# Patient Record
Sex: Female | Born: 1945 | Race: Black or African American | Hispanic: No | Marital: Single | Smoking: Former smoker
Health system: Southern US, Community
[De-identification: ages and names within clinical notes are randomized; demographics above are authoritative.]

## PROBLEM LIST (undated history)

## (undated) DIAGNOSIS — R011 Cardiac murmur, unspecified: Secondary | ICD-10-CM

## (undated) DIAGNOSIS — R12 Heartburn: Secondary | ICD-10-CM

## (undated) DIAGNOSIS — N189 Chronic kidney disease, unspecified: Secondary | ICD-10-CM

## (undated) DIAGNOSIS — J439 Emphysema, unspecified: Secondary | ICD-10-CM

## (undated) DIAGNOSIS — IMO0001 Reserved for inherently not codable concepts without codable children: Secondary | ICD-10-CM

## (undated) DIAGNOSIS — J302 Other seasonal allergic rhinitis: Secondary | ICD-10-CM

## (undated) DIAGNOSIS — G4730 Sleep apnea, unspecified: Secondary | ICD-10-CM

## (undated) DIAGNOSIS — I499 Cardiac arrhythmia, unspecified: Secondary | ICD-10-CM

## (undated) DIAGNOSIS — G4700 Insomnia, unspecified: Secondary | ICD-10-CM

## (undated) DIAGNOSIS — Z86010 Personal history of colonic polyps: Secondary | ICD-10-CM

## (undated) DIAGNOSIS — R112 Nausea with vomiting, unspecified: Secondary | ICD-10-CM

## (undated) DIAGNOSIS — M549 Dorsalgia, unspecified: Secondary | ICD-10-CM

## (undated) DIAGNOSIS — Z9981 Dependence on supplemental oxygen: Secondary | ICD-10-CM

## (undated) DIAGNOSIS — J189 Pneumonia, unspecified organism: Secondary | ICD-10-CM

## (undated) DIAGNOSIS — G459 Transient cerebral ischemic attack, unspecified: Secondary | ICD-10-CM

## (undated) DIAGNOSIS — M1990 Unspecified osteoarthritis, unspecified site: Secondary | ICD-10-CM

## (undated) DIAGNOSIS — F329 Major depressive disorder, single episode, unspecified: Secondary | ICD-10-CM

## (undated) DIAGNOSIS — K759 Inflammatory liver disease, unspecified: Secondary | ICD-10-CM

## (undated) DIAGNOSIS — R569 Unspecified convulsions: Secondary | ICD-10-CM

## (undated) DIAGNOSIS — R51 Headache: Secondary | ICD-10-CM

## (undated) DIAGNOSIS — F32A Depression, unspecified: Secondary | ICD-10-CM

## (undated) DIAGNOSIS — I739 Peripheral vascular disease, unspecified: Secondary | ICD-10-CM

## (undated) DIAGNOSIS — J449 Chronic obstructive pulmonary disease, unspecified: Secondary | ICD-10-CM

## (undated) DIAGNOSIS — G8929 Other chronic pain: Secondary | ICD-10-CM

## (undated) DIAGNOSIS — B999 Unspecified infectious disease: Secondary | ICD-10-CM

## (undated) DIAGNOSIS — E785 Hyperlipidemia, unspecified: Secondary | ICD-10-CM

## (undated) DIAGNOSIS — M810 Age-related osteoporosis without current pathological fracture: Secondary | ICD-10-CM

## (undated) DIAGNOSIS — I10 Essential (primary) hypertension: Secondary | ICD-10-CM

## (undated) DIAGNOSIS — F419 Anxiety disorder, unspecified: Secondary | ICD-10-CM

## (undated) DIAGNOSIS — I809 Phlebitis and thrombophlebitis of unspecified site: Secondary | ICD-10-CM

## (undated) DIAGNOSIS — R609 Edema, unspecified: Secondary | ICD-10-CM

## (undated) DIAGNOSIS — K219 Gastro-esophageal reflux disease without esophagitis: Secondary | ICD-10-CM

## (undated) HISTORY — PX: DIAGNOSTIC LAPAROSCOPY: SUR761

## (undated) HISTORY — PX: TUBAL LIGATION: SHX77

## (undated) HISTORY — PX: CHOLECYSTECTOMY: SHX55

## (undated) HISTORY — PX: ESOPHAGOGASTRODUODENOSCOPY: SHX1529

## (undated) HISTORY — PX: COLONOSCOPY: SHX174

## (undated) HISTORY — PX: WISDOM TOOTH EXTRACTION: SHX21

---

## 2008-07-24 HISTORY — PX: MULTIPLE TOOTH EXTRACTIONS: SHX2053

## 2010-07-24 HISTORY — PX: SPINAL CORD STIMULATOR INSERTION: SHX5378

## 2013-07-24 DIAGNOSIS — B999 Unspecified infectious disease: Secondary | ICD-10-CM

## 2013-07-24 HISTORY — DX: Unspecified infectious disease: B99.9

## 2013-07-24 HISTORY — PX: CARDIAC CATHETERIZATION: SHX172

## 2013-07-24 HISTORY — PX: SPINAL CORD STIMULATOR REMOVAL: SHX2423

## 2013-11-18 ENCOUNTER — Other Ambulatory Visit: Payer: Self-pay | Admitting: Neurological Surgery

## 2013-12-23 ENCOUNTER — Encounter (HOSPITAL_COMMUNITY): Payer: Self-pay | Admitting: Pharmacy Technician

## 2013-12-29 ENCOUNTER — Encounter (HOSPITAL_COMMUNITY): Payer: Self-pay

## 2013-12-29 ENCOUNTER — Ambulatory Visit (HOSPITAL_COMMUNITY)
Admission: RE | Admit: 2013-12-29 | Discharge: 2013-12-29 | Disposition: A | Discharge status: Home / Self Care | Payer: Medicare Other | Source: Ambulatory Visit | Attending: Anesthesiology | Admitting: Anesthesiology

## 2013-12-29 ENCOUNTER — Encounter (HOSPITAL_COMMUNITY)
Admission: RE | Admit: 2013-12-29 | Discharge: 2013-12-29 | Disposition: A | Discharge status: Home / Self Care | Payer: Medicare Other | Source: Ambulatory Visit | Attending: Neurological Surgery | Admitting: Neurological Surgery

## 2013-12-29 ENCOUNTER — Ambulatory Visit (HOSPITAL_COMMUNITY): Admission: RE | Admit: 2013-12-29 | Payer: No Typology Code available for payment source | Source: Ambulatory Visit

## 2013-12-29 DIAGNOSIS — M439 Deforming dorsopathy, unspecified: Secondary | ICD-10-CM | POA: Insufficient documentation

## 2013-12-29 DIAGNOSIS — Z01810 Encounter for preprocedural cardiovascular examination: Secondary | ICD-10-CM | POA: Insufficient documentation

## 2013-12-29 DIAGNOSIS — Z01818 Encounter for other preprocedural examination: Secondary | ICD-10-CM | POA: Insufficient documentation

## 2013-12-29 DIAGNOSIS — Z01812 Encounter for preprocedural laboratory examination: Secondary | ICD-10-CM | POA: Insufficient documentation

## 2013-12-29 HISTORY — DX: Emphysema, unspecified: J43.9

## 2013-12-29 HISTORY — DX: Gastro-esophageal reflux disease without esophagitis: K21.9

## 2013-12-29 HISTORY — DX: Unspecified convulsions: R56.9

## 2013-12-29 HISTORY — DX: Transient cerebral ischemic attack, unspecified: G45.9

## 2013-12-29 HISTORY — DX: Heartburn: R12

## 2013-12-29 HISTORY — DX: Unspecified osteoarthritis, unspecified site: M19.90

## 2013-12-29 HISTORY — DX: Personal history of colonic polyps: Z86.010

## 2013-12-29 HISTORY — DX: Peripheral vascular disease, unspecified: I73.9

## 2013-12-29 HISTORY — DX: Reserved for inherently not codable concepts without codable children: IMO0001

## 2013-12-29 HISTORY — DX: Other chronic pain: G89.29

## 2013-12-29 HISTORY — DX: Other seasonal allergic rhinitis: J30.2

## 2013-12-29 HISTORY — DX: Phlebitis and thrombophlebitis of unspecified site: I80.9

## 2013-12-29 HISTORY — DX: Dorsalgia, unspecified: M54.9

## 2013-12-29 HISTORY — DX: Inflammatory liver disease, unspecified: K75.9

## 2013-12-29 HISTORY — DX: Chronic obstructive pulmonary disease, unspecified: J44.9

## 2013-12-29 HISTORY — DX: Hyperlipidemia, unspecified: E78.5

## 2013-12-29 HISTORY — DX: Nausea with vomiting, unspecified: R11.2

## 2013-12-29 HISTORY — DX: Headache: R51

## 2013-12-29 HISTORY — DX: Major depressive disorder, single episode, unspecified: F32.9

## 2013-12-29 HISTORY — DX: Insomnia, unspecified: G47.00

## 2013-12-29 HISTORY — DX: Edema, unspecified: R60.9

## 2013-12-29 HISTORY — DX: Essential (primary) hypertension: I10

## 2013-12-29 HISTORY — DX: Pneumonia, unspecified organism: J18.9

## 2013-12-29 HISTORY — DX: Age-related osteoporosis without current pathological fracture: M81.0

## 2013-12-29 HISTORY — DX: Depression, unspecified: F32.A

## 2013-12-29 LAB — COMPREHENSIVE METABOLIC PANEL
ALT: 8 U/L (ref 0–35)
AST: 13 U/L (ref 0–37)
Albumin: 3.6 g/dL (ref 3.5–5.2)
Alkaline Phosphatase: 74 U/L (ref 39–117)
BUN: 15 mg/dL (ref 6–23)
CHLORIDE: 101 meq/L (ref 96–112)
CO2: 23 mEq/L (ref 19–32)
Calcium: 9.5 mg/dL (ref 8.4–10.5)
Creatinine, Ser: 1.38 mg/dL — ABNORMAL HIGH (ref 0.50–1.10)
GFR calc Af Amer: 44 mL/min — ABNORMAL LOW (ref >90)
GFR calc non Af Amer: 38 mL/min — ABNORMAL LOW (ref >90)
Glucose, Bld: 97 mg/dL (ref 70–99)
Potassium: 4 mEq/L (ref 3.7–5.3)
Sodium: 141 mEq/L (ref 137–147)
Total Bilirubin: 0.3 mg/dL (ref 0.3–1.2)
Total Protein: 7.5 g/dL (ref 6.0–8.3)

## 2013-12-29 LAB — SURGICAL PCR SCREEN
MRSA, PCR: NEGATIVE
Staphylococcus aureus: NEGATIVE

## 2013-12-29 LAB — CBC
HEMATOCRIT: 43.4 % (ref 36.0–46.0)
Hemoglobin: 13.8 g/dL (ref 12.0–15.0)
MCH: 26 pg (ref 26.0–34.0)
MCHC: 31.8 g/dL (ref 30.0–36.0)
MCV: 81.7 fL (ref 78.0–100.0)
Platelets: 262 10*3/uL (ref 150–400)
RBC: 5.31 MIL/uL — ABNORMAL HIGH (ref 3.87–5.11)
RDW: 14.4 % (ref 11.5–15.5)
WBC: 7.6 10*3/uL (ref 4.0–10.5)

## 2013-12-29 LAB — TYPE AND SCREEN
ABO/RH(D): O POS
Antibody Screen: NEGATIVE

## 2013-12-29 LAB — ABO/RH: ABO/RH(D): O POS

## 2013-12-29 NOTE — Pre-Procedure Instructions (Signed)
[facility]  12/29/2013   Your procedure is scheduled on:  Wed, June 17 @ 8:30 AM  Report to [facility]  at 6:30 AM.  Call this number if you have problems the morning of surgery: [phone]   Remember:   Do not eat food or drink liquids after midnight.   Take these medicines the morning of surgery with A SIP OF WATER: Atenolol(Tenormin),Keppra(Levetiracetam),Claritin(Loratadine-if needed),Protonix(Pantoprazole),and Phenergan(Promethazine-if needed)               Stop taking Plavix and Mag Ox. No Goody's,BC's,Aleve,Aspirin,Ibuprofen,Fish Oil,or any Herbal Medications   Do not wear jewelry, make-up or nail polish.  Do not wear lotions, powders, or perfumes. You may wear deodorant.  Do not shave 48 hours prior to surgery.   Do not bring valuables to the hospital.  [facility] is not responsible                  for any belongings or valuables.               Contacts, dentures or bridgework may not be worn into surgery.  Leave suitcase in the car. After surgery it may be brought to your room.  For patients admitted to the hospital, discharge time is determined by your                treatment team.               Patients discharged the day of surgery will not be allowed to drive  home.    Special Instructions:  [facility] - Preparing for Surgery  Before surgery, you can play an important role.  Because skin is not sterile, your skin needs to be as free of germs as possible.  You can reduce the number of germs on you skin by washing with CHG (chlorahexidine gluconate) soap before surgery.  CHG is an antiseptic cleaner which kills germs and bonds with the skin to continue killing germs even after washing.  Please DO NOT use if you have an allergy to CHG or antibacterial soaps.  If your skin becomes reddened/irritated stop using the CHG and inform your nurse when you arrive at Short Stay.  Do not shave (including legs and underarms) for at least 48 hours prior to the first CHG  shower.  You may shave your face.  Please follow these instructions carefully:   1.  Shower with CHG Soap the night before surgery and the                                morning of Surgery.  2.  If you choose to wash your hair, wash your hair first as usual with your       normal shampoo.  3.  After you shampoo, rinse your hair and body thoroughly to remove the                      Shampoo.  4.  Use CHG as you would any other liquid soap.  You can apply chg directly       to the skin and wash gently with scrungie or a clean washcloth.  5.  Apply the CHG Soap to your body ONLY FROM THE NECK DOWN.        Do not use on open wounds or open sores.  Avoid contact with your eyes,  ears, mouth and genitals (private parts).  Wash genitals (private parts)       with your normal soap.  6.  Wash thoroughly, paying special attention to the area where your surgery        will be performed.  7.  Thoroughly rinse your body with warm water from the neck down.  8.  DO NOT shower/wash with your normal soap after using and rinsing off       the CHG Soap.  9.  Pat yourself dry with a clean towel.            10.  Wear clean pajamas.            11.  Place clean sheets on your bed the night of your first shower and do not        sleep with pets.  Day of Surgery  Do not apply any lotions/deoderants the morning of surgery.  Please wear clean clothes to the hospital/surgery center.     Please read over the following fact sheets that you were given: Pain Booklet, Coughing and Deep Breathing, Blood Transfusion Information, MRSA Information and Surgical Site Infection Prevention

## 2013-12-29 NOTE — Progress Notes (Signed)
Has a cardiologist with last visit several  of yrs ago;states he is in [place] sees him as needed  Echo done at [facility] several yrs ago  Denies ever having a heart cath  Stress test done 10+yrs ago  Medical Md is [place] with [facility] in [place]  Denies EKG or CXR in past yr

## 2013-12-30 ENCOUNTER — Encounter (HOSPITAL_COMMUNITY): Payer: Self-pay | Admitting: Vascular Surgery

## 2013-12-30 ENCOUNTER — Encounter (HOSPITAL_COMMUNITY): Payer: Self-pay | Admitting: Certified Registered"

## 2013-12-30 NOTE — Progress Notes (Addendum)
Anesthesia chart review:  Patient is a 68 year old female scheduled for L4-5, L5-S1 PLIF on 01/07/14 by Dr. Yetta BarreJones.  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 Phillips GroutPhoncharoensri's note 03/2012), hepatitis C exposure s/p gamma globulin (unsure if she tested +), HLD, osteoarthritis, osteoporosis, chronic back pain, C. Difficile, chronic intermittent N/V, cholecystectomy.  Plavix held since 12/30/13 (on due to history of CVA).  PCP is Dr. Damien FusiVictoria Nnadi with [facility] in [place]. 10/10/13 records received also state that patient has CKD stage III, moderate to severe COPD (Dr. Jerre SimonJavaid 07/2012), severe pulmonary hypertension by 2014 echo, a thoracic aortic aneurysm (4.2 cm--date?--followed by Dr. Althea GrimmerBorchelt with [facility]), and an AAA (4.4 cm by renal ultrasound 08/2012 followed at [facility]). Her note also mentions that a carotid ultrasound in 03/2012 showed 80-99% RICA stenosis, but follow-up on 09/2012 showed bilateral plaque but no hemodynamically significant stenosis.  She was evaluated by cardiologist Dr. Vilinda BoehringerGary Renaldo in 2014 for an abnormal echocardiogram that showed severe pulmonary hypertension.  He felt this was due to her COPD and did not recommend further cardiac work-up at that time.    EKG on 12/29/13 showed NSR, cannot rule out anterior infarct (age undetermined). Overall, I think her EKG is stable since 11/05/12 ([facility]).  Echo on 08/27/12 showed: The LV is normal in size and wall thickness.  Mild 1+ TR.  RVSP is elevated at 67 mmHg, consistent with severe pulmonary hypertension. LVEF 60-65%. Normal LV wall motion.   Her last stress test was 10 years ago (08/12/03; [facility]).  She had a non-diagnostic stress echo due to inability to achieve there target HR, but otherwise showed no stress induced  ischemia at the stress level reached.   CXR on 12/29/13 showed: No acute infiltrate or pulmonary edema. Mild compression deformity mid thoracic spine of indeterminate age. Clinical correlation is necessary. Report routed to Dr. Yetta BarreJones.  Preoperative labs noted.  AST/ALT WNL.    I called to speak with patient this afternoon.  I was told the home number was wrong.  I did leave a voicemail on the cell number listed for patient to contact me to clarify her history and timing of her last vascular/CT follow-up.  I did review currently available records with anesthesiologist Dr. Maple HudsonMoser.  He agrees that patient should at least have medical clearance--primarily we want to know that Dr. Lissa MoralesNnadi feels her pulmonary hypertension, aneurysms, etc are being optimally managed.  At this point we would defer further preoperative referral to Dr. Lissa MoralesNnadi. I will notify Dr. Yetta BarreJones' office tomorrow. [facility] at Dr. Yetta BarreJones office was notified of need for clearance on 12/31/13 @ 9:31 AM.  I faxed her a copy of my anesthesia note and Dr. Glory RosebushNnadi's last office note. I will also fax Dr. Lissa MoralesNnadi a copy of my note.  Chart will be placed for nurse follow-up while Dr. Yetta BarreJones is awaiting input from Dr. Lissa MoralesNnadi.)  Destiny ChockAllison Burns Timson, PA-C [facility]/Anesthesiology Phone [phone] 12/30/2013 5:12 PM  Addendum: 01/01/2014 11:24 AM I received a call from patient.  She is tearful about the amount of back pain she is experiencing. She denied chest pain or new SOB.  She continues to smoke.  Her previous pulmonary and cardiology visits where arranged following her CVA hospitalization.  She is no longer being actively followed by either. Her aneurysms were previously being followed by Dr. Felizardo HoffmannBradley Thomason  in [place].  She said he was probably 1-2 years since they were last evaluated. I did notify her that anesthesia had asked Dr. Yetta Barre to get medical clearance for her surgery.    Addendum: 01/02/2014 5:18 PM I received notes from Dr. Vilinda Boehringer from today.  Apparently, she was referred to him for a preoperative evaluation.  He recommended that with her limited activity, smoking, and other risk factors that she have a preoperative Lexiscan stress Cardiolite and also see her pulmonologist preoperatively.  I am out of the office next week, so I will leave her chart for nurse follow-up who can have one of the anesthesiologists review additional records as received.   Also carotid duplex on 10/11/12 received and showed: No hemodynamically significant stenosis on either side. Bilateral plaque. Stenosis is validated by velocity measurements with corresponding angiographic measurements of velocity criteria extrapolated in a process based on that defined by the society radiologist an ultrasound.

## 2014-01-01 ENCOUNTER — Encounter (HOSPITAL_COMMUNITY): Payer: Self-pay

## 2014-01-06 MED ORDER — CEFAZOLIN SODIUM-DEXTROSE 2-3 GM-% IV SOLR: 2.0000 g | INTRAVENOUS | Status: DC

## 2014-01-06 NOTE — Progress Notes (Signed)
Talked with Erie NoeVanessa @ Dr. Ileana Laddram's  Office,pt. Is scheduled to see physician  today for clearance. Erie NoeVanessa will call when she has the clearance..Marland Kitchen

## 2014-01-07 ENCOUNTER — Inpatient Hospital Stay (HOSPITAL_COMMUNITY): Admission: RE | Admit: 2014-01-07 | Payer: Medicare Other | Source: Ambulatory Visit | Admitting: Neurological Surgery

## 2014-01-07 ENCOUNTER — Encounter (HOSPITAL_COMMUNITY): Admission: RE | Payer: Self-pay | Source: Ambulatory Visit

## 2014-01-07 SURGERY — FOR MAXIMUM ACCESS (MAS) POSTERIOR LUMBAR INTERBODY FUSION (PLIF) 2 LEVEL
Anesthesia: General | Site: Back

## 2014-01-08 ENCOUNTER — Ambulatory Visit (HOSPITAL_COMMUNITY): Payer: No Typology Code available for payment source | Admitting: Psychiatry

## 2015-01-08 ENCOUNTER — Other Ambulatory Visit: Payer: Self-pay | Admitting: Neurological Surgery

## 2015-01-19 ENCOUNTER — Encounter (HOSPITAL_COMMUNITY): Payer: Self-pay

## 2015-01-19 ENCOUNTER — Encounter (HOSPITAL_COMMUNITY)
Admission: RE | Admit: 2015-01-19 | Discharge: 2015-01-19 | Disposition: A | Discharge status: Home / Self Care | Payer: Medicare Other | Source: Ambulatory Visit | Attending: Neurological Surgery | Admitting: Neurological Surgery

## 2015-01-19 DIAGNOSIS — M4310 Spondylolisthesis, site unspecified: Secondary | ICD-10-CM | POA: Diagnosis not present

## 2015-01-19 DIAGNOSIS — I77810 Thoracic aortic ectasia: Secondary | ICD-10-CM | POA: Insufficient documentation

## 2015-01-19 DIAGNOSIS — I10 Essential (primary) hypertension: Secondary | ICD-10-CM | POA: Diagnosis not present

## 2015-01-19 DIAGNOSIS — E785 Hyperlipidemia, unspecified: Secondary | ICD-10-CM | POA: Diagnosis not present

## 2015-01-19 DIAGNOSIS — J439 Emphysema, unspecified: Secondary | ICD-10-CM | POA: Diagnosis not present

## 2015-01-19 DIAGNOSIS — Z0183 Encounter for blood typing: Secondary | ICD-10-CM | POA: Diagnosis not present

## 2015-01-19 DIAGNOSIS — Z7902 Long term (current) use of antithrombotics/antiplatelets: Secondary | ICD-10-CM | POA: Insufficient documentation

## 2015-01-19 DIAGNOSIS — Z79899 Other long term (current) drug therapy: Secondary | ICD-10-CM | POA: Diagnosis not present

## 2015-01-19 DIAGNOSIS — Z01818 Encounter for other preprocedural examination: Secondary | ICD-10-CM | POA: Diagnosis present

## 2015-01-19 DIAGNOSIS — Z8673 Personal history of transient ischemic attack (TIA), and cerebral infarction without residual deficits: Secondary | ICD-10-CM | POA: Diagnosis not present

## 2015-01-19 DIAGNOSIS — K219 Gastro-esophageal reflux disease without esophagitis: Secondary | ICD-10-CM | POA: Insufficient documentation

## 2015-01-19 DIAGNOSIS — I739 Peripheral vascular disease, unspecified: Secondary | ICD-10-CM | POA: Insufficient documentation

## 2015-01-19 DIAGNOSIS — Z01812 Encounter for preprocedural laboratory examination: Secondary | ICD-10-CM | POA: Insufficient documentation

## 2015-01-19 DIAGNOSIS — Z87891 Personal history of nicotine dependence: Secondary | ICD-10-CM | POA: Diagnosis not present

## 2015-01-19 DIAGNOSIS — G4733 Obstructive sleep apnea (adult) (pediatric): Secondary | ICD-10-CM | POA: Diagnosis not present

## 2015-01-19 HISTORY — DX: Chronic kidney disease, unspecified: N18.9

## 2015-01-19 HISTORY — DX: Dependence on supplemental oxygen: Z99.81

## 2015-01-19 HISTORY — DX: Unspecified infectious disease: B99.9

## 2015-01-19 HISTORY — DX: Cardiac arrhythmia, unspecified: I49.9

## 2015-01-19 HISTORY — DX: Anxiety disorder, unspecified: F41.9

## 2015-01-19 HISTORY — DX: Cardiac murmur, unspecified: R01.1

## 2015-01-19 HISTORY — DX: Sleep apnea, unspecified: G47.30

## 2015-01-19 LAB — TYPE AND SCREEN
ABO/RH(D): O POS
Antibody Screen: NEGATIVE

## 2015-01-19 LAB — CBC WITH DIFFERENTIAL/PLATELET
BASOS ABS: 0.1 10*3/uL (ref 0.0–0.1)
Basophils Relative: 1 % (ref 0–1)
EOS PCT: 0 % (ref 0–5)
Eosinophils Absolute: 0 10*3/uL (ref 0.0–0.7)
HEMATOCRIT: 44.3 % (ref 36.0–46.0)
HEMOGLOBIN: 14.3 g/dL (ref 12.0–15.0)
LYMPHS ABS: 2.1 10*3/uL (ref 0.7–4.0)
LYMPHS PCT: 19 % (ref 12–46)
MCH: 26.6 pg (ref 26.0–34.0)
MCHC: 32.3 g/dL (ref 30.0–36.0)
MCV: 82.5 fL (ref 78.0–100.0)
Monocytes Absolute: 0.7 10*3/uL (ref 0.1–1.0)
Monocytes Relative: 6 % (ref 3–12)
NEUTROS ABS: 8.1 10*3/uL — AB (ref 1.7–7.7)
Neutrophils Relative %: 74 % (ref 43–77)
Platelets: 229 10*3/uL (ref 150–400)
RBC: 5.37 MIL/uL — ABNORMAL HIGH (ref 3.87–5.11)
RDW: 17.8 % — AB (ref 11.5–15.5)
WBC: 11 10*3/uL — AB (ref 4.0–10.5)

## 2015-01-19 LAB — PROTIME-INR
INR: 1.06 (ref 0.00–1.49)
Prothrombin Time: 14 seconds (ref 11.6–15.2)

## 2015-01-19 LAB — BASIC METABOLIC PANEL
Anion gap: 9 (ref 5–15)
BUN: 32 mg/dL — AB (ref 6–20)
CO2: 24 mmol/L (ref 22–32)
Calcium: 9.5 mg/dL (ref 8.9–10.3)
Chloride: 105 mmol/L (ref 101–111)
Creatinine, Ser: 1.59 mg/dL — ABNORMAL HIGH (ref 0.44–1.00)
GFR calc Af Amer: 37 mL/min — ABNORMAL LOW (ref >60)
GFR calc non Af Amer: 32 mL/min — ABNORMAL LOW (ref >60)
GLUCOSE: 88 mg/dL (ref 65–99)
POTASSIUM: 5.4 mmol/L — AB (ref 3.5–5.1)
SODIUM: 138 mmol/L (ref 135–145)

## 2015-01-19 LAB — SURGICAL PCR SCREEN
MRSA, PCR: NEGATIVE
STAPHYLOCOCCUS AUREUS: NEGATIVE

## 2015-01-19 NOTE — Pre-Procedure Instructions (Addendum)
    Marvetta GibbonsSherry Rivest  01/19/2015      [facility] - [place] - [address] [place] [address] Phone: [phone] Fax: [phone]    Your procedure is scheduled on 01/28/2015 at 11:20 A.M.   [facility] at 9:20 A.M.  Call this number if you have problems the morning of surgery:  [phone]   Remember:  Do not eat food or drink liquids after midnight.  Take these medicines the morning of surgery with A SIP OF WATER Atenolol, Protonix, Claritin, Maalox, Albuterol Inhaler.     Stop Plavix, Magnesium-Oxide, Goody's/BC powders, Nonsteroidal Anti-inflammatories (NSAIDS), Aspirins, Vitamins and Herbal Supplements 7 days prior to surgery.     Do not wear jewelry, make-up or nail polish.  Do not wear lotions, powders, or perfumes.  You may wear deodorant.  Do not shave 48 hours prior to surgery.  Do not bring valuables to the hospital.  [facility] is not responsible for any belongings or valuables.  Contacts, dentures or bridgework may not be worn into surgery.  Leave your suitcase in the car.  After surgery it may be brought to your room.  For patients admitted to the hospital, discharge time will be determined by your treatment team.  Patients discharged the day of surgery will not be allowed to drive home.    Please read over the following fact sheets that you were given. Pain Booklet, Coughing and Deep Breathing, MRSA Information and Surgical Site Infection Prevention

## 2015-01-19 NOTE — Progress Notes (Signed)
Cardiologist: Dr. Vilinda BoehringerGary Renaldo- [facility]  Pulmonologist:  Dr. Lorne SkeensJavaid- [facility]  ECHO: 2014 [facility] (Care Everywhere)  EKG: Preadmission  Stress Test:  2015 [facility] (Care Everywhere)  Cardiac Cath: 2015 [facility](Care Everywhere)  Patient states she has been diagnosed with sleep apnea and has a CPAP at home but does not wear it.  She occasionally wears 2L of oxygen at night.

## 2015-01-20 NOTE — Progress Notes (Signed)
Anesthesia Chart Review:  Pt is 69 year old female scheduled for L4-5, L5-S1 PLIF on 01/28/2015 with Dr. Yetta BarreJones.   PMH includes: HTN, moderate COPD, TIA, PVD, hepatitis C, heart murmur, OSA (not on CPAP, uses 2L O2 at night), remote hx seizures, GERD, hyperlipidemia, dysrhythmia, descending thoracic aortic aneurysm (5.3x5.2 cm that tapers to 4.8cm at diaphragmatic hernia increased from 4.9x4.9cm in 2013 on CT 02/02/14), AAA ("mild aneurysmal dilatation of the upper abdominal aorta" by CT 01/05/14). Former smoker. BMI 29.   Medications include: albuterol, atenolol, plavix, combivent, lasix, hydralazine, lyrica, protonix, zocor.   Preoperative labs reviewed.    Chest x-ray 01/19/2015 reviewed. Tortuosity and ectasia of the thoracic aorta. No acute cardiopulmonary disease.  EKG 01/19/2015: NSR.   Right heart cath 04/24/2014 (care everywhere): -Normal central intracardiac filling pressures for age -No significant pulmonary arterial hypertension  Nuclear stress test 02/05/2014: -There is normal perfusion of the left ventricle. No reversible or fixed perfusion defects are identified. -Left ventricular ejection fraction is calculated to be 79 %. -Normal cardiac perfusion exam. Prognostically this is a low risk scan.  Carotid duplex US 10/11/12:  -No hemodynamically significant stenosis on either side. Bilateral plaque.   Echo 08/27/12:  -The LV is normal in size and wall thickness.  -Mild 1+ TR.  -RVSP is elevated at 67 mmHg, consistent with severe pulmonary hypertension.  -LVEF 60-65%. Normal LV wall motion.   Spoke to pt. She has not had her aneurysms evaluated since 2013 she thinks.   Discussed case with Dr. Krista BlueSinger. Pt will need vascular surgery clearance for increased thoracic aortic aneurysm prior to surgery. Notified Erie NoeVanessa in Dr. Yetta BarreJones' office.   Destiny Mastngela Hattie Pine, FNP-BC [facility] Short Stay Surgical Center/Anesthesiology Phone: [phone] 01/20/2015 4:28 PM

## 2015-01-26 NOTE — Progress Notes (Signed)
Spoke to [facility] at Dr. Yetta BarreJones office states that patient had an appointment at Vascular Surgery on Friday they have not received results. [facility] Note is not in CareEverywhere yet.

## 2015-01-27 NOTE — Progress Notes (Signed)
Still waiting on vascular clearance so surgery date was changed to 02/04/15.  [facility] for pt appointment to repeat labs as they will be too old by that date.

## 2015-01-28 ENCOUNTER — Encounter: Payer: Self-pay | Admitting: Vascular Surgery

## 2015-02-01 ENCOUNTER — Encounter: Payer: Self-pay | Admitting: Surgery

## 2015-02-05 NOTE — Progress Notes (Signed)
Anesthesia follow-up:  See notation by Kabbe,Destiny MomentNP-BC on 01/20/15. Patient was scheduled for L4-5, L5-S1 PLIF on 01/28/2015 with Dr. Yetta Barre; however, anesthesia recommended vascular surgery clearance due to increased size of a thoracic aortic aneursym and AAA. [facility] AAA is now 4.9 cm.  [facility] descending TAA is 5.5 cm. Vascular surgeon Dr. Netta Neat [facility]; see Care Everywhere) elected to watch the TAA until it reached closer to 6 cm. See his summary below: Assessment/Plan: Presents to clinic today for evaluation concerning [facility] thoracic aortic aneurysm and abdominal aortic aneurysm. [facility] is scheduled to have lower spine surgery and is in need of clearance prior to proceeding with this. [facility] recently underwent an abdominal ultrasound which redemonstrated [facility] abdominal aortic aneurysm to now measure 4.9 cm which was previously 4.4 cm in 2014. As far as [facility] abdominal aortic aneurysm is concerned we discussed that in general we elected not to fix these until he reaches size of 5.5 cm. As far as [facility] thoracic aortic aneurysm this measures 5.4 cm we've also discussed the natural history of this and that in general we elected to fix easily reaches size between 5.5 cm and 6 cm. I then depending reviewed [facility] CT imaging of [facility] thoracic aneurysm which is a noncontrasted study. 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 Given this it appears [facility] would likely need a deep branching operation in combination with placement of a thoracic endograft. This surgery would carry significant perioperative morbidity mortality risk. In patients such as this we typically elect to watch these further until they reaches size closer to 6 cm. Indeed [facility] would be at some increased risk compared to the general population to undergo spine surgery secondary to [facility] thoracic and abdominal aortic aneurysm however I feel this  risk is still small. Given [facility] lower spine disease is so debilitating to [facility] and [facility] states [facility] has no quality of life is reasonable to proceed with [facility] spine surgery while we continue to observe [facility] multiple aneurysms. I discussed with [facility] that if [facility] lower spine symptoms are manageable and I would recommend we hold off on spine surgery while we are observing [facility] aneurysms. But given [facility] is so debilitated by [facility] lower spine disease and appears frankly depressed by [facility] lack of quality of life I feel is reasonable to proceed with [facility] spine surgery. We have discussed [facility] small increased risk of proceeding with surgery and [facility] understands this risk and is willing to proceed. At this point we'll plan on seeing [facility] back in 6 months at that point I will plan on obtaining a creatinine. If [facility] creatinine is in a reasonable level we will plan on obtaining a CTA of [facility] chest abdomen and pelvis to further delineate [facility] arterial anatomy.  Prior cardiac studies are outlined in Kabbe's note, but can also be viewed in Care Everywhere.  Vascular imaging reports are outlined below, but can also be viewed in Care Everywhere.  01/29/15 Chest CT: FINDINGS: Compare 02/02/14. CT chest: Again seen is aneurysmal dilatation of the thoracic aorta beginning at the innominate takeoff and extending throughout the descending thoracic aorta. Overall, aneurysmal dilatation appears mildly increased from prior study with maximal diameter at the innominate takeoff measuring 4.9 cm (previous 4.8 cm) and descending thoracic aorta measuring 5.5 x 5.4 cm at the level of the pulmonary artery bifurcation image 34 (  previous 5.3 x 5.2 cm). The ascending aorta measures slightly larger in AP diameter, measuring 3.6 cm on image 40 (previous 3.3 cm). Normal heart size. Coronary and aortic ASVD. No adenopathy. No pleural or pericardial effusion. The trachea and mainstem bronchi are patent. Lungs remain mildly emphysematous with a stable 4 mm left lower lobe  pulmonary nodule image 47. Upper abdomen again remarkable for pneumobilia and cholecystectomy.  IMPRESSION: Mildly progressive aneurysmal dilatation.   01/22/15 AAA aortic duplex:  Bilateral: No significant common iliac artery dilatations. Aorta: The proximal abdominal aorta is dilated, with a maximum diameter of 4.9 cm. Conclusions: Proximal abdominal aortic aneurysm with a maximum diameter of 4.9 cm.  No evidence of bilateral common iliac artery aneurysms.    01/22/15 LE aneurysm scan bilateral: Right: The common femoral artery is mildly dilated, measuring 1.5 cm in maximum diameter. No evidence of significant SFA, or popliteal artery dilatations. Left: No evidence of significant CFA, SFA, or popliteal artery dilatations. Conclusions: The right common femoral artery is ectatic with a maximum diameter of 1.5 cm.  No evidence of significant right SFA, or popliteal artery aneurysms. No evidence of significant left CFA, SFA, or popliteal artery aneurysms.  Surgery has been rescheduled for 02/18/15, and [facility] is scheduled for repeat labs on 02/15/15.  Velna Ochsllison Amarilys Lyles, PA-C [facility] Short Stay Center/Anesthesiology Phone [phone] 02/05/2015 6:06 PM

## 2015-02-08 ENCOUNTER — Encounter (HOSPITAL_COMMUNITY)
Admission: RE | Admit: 2015-02-08 | Discharge: 2015-02-08 | Disposition: A | Discharge status: Home / Self Care | Payer: Medicare Other | Source: Ambulatory Visit | Attending: Neurological Surgery | Admitting: Neurological Surgery

## 2015-02-08 DIAGNOSIS — Z01818 Encounter for other preprocedural examination: Secondary | ICD-10-CM | POA: Insufficient documentation

## 2015-02-08 LAB — BASIC METABOLIC PANEL
Anion gap: 8 (ref 5–15)
BUN: 18 mg/dL (ref 6–20)
CO2: 24 mmol/L (ref 22–32)
Calcium: 9.6 mg/dL (ref 8.9–10.3)
Chloride: 108 mmol/L (ref 101–111)
Creatinine, Ser: 1.34 mg/dL — ABNORMAL HIGH (ref 0.44–1.00)
GFR calc Af Amer: 45 mL/min — ABNORMAL LOW (ref >60)
GFR calc non Af Amer: 39 mL/min — ABNORMAL LOW (ref >60)
Glucose, Bld: 88 mg/dL (ref 65–99)
Potassium: 5 mmol/L (ref 3.5–5.1)
Sodium: 140 mmol/L (ref 135–145)

## 2015-02-08 LAB — HEPATIC FUNCTION PANEL
ALBUMIN: 3.7 g/dL (ref 3.5–5.0)
ALT: 11 U/L — AB (ref 14–54)
AST: 16 U/L (ref 15–41)
Alkaline Phosphatase: 68 U/L (ref 38–126)
BILIRUBIN TOTAL: 0.5 mg/dL (ref 0.3–1.2)
Bilirubin, Direct: <0.1 mg/dL — ABNORMAL LOW (ref 0.1–0.5)
Total Protein: 7.2 g/dL (ref 6.5–8.1)

## 2015-02-08 LAB — CBC
HCT: 42.2 % (ref 36.0–46.0)
Hemoglobin: 13.9 g/dL (ref 12.0–15.0)
MCH: 27.6 pg (ref 26.0–34.0)
MCHC: 32.9 g/dL (ref 30.0–36.0)
MCV: 83.7 fL (ref 78.0–100.0)
PLATELETS: 181 10*3/uL (ref 150–400)
RBC: 5.04 MIL/uL (ref 3.87–5.11)
RDW: 18.3 % — ABNORMAL HIGH (ref 11.5–15.5)
WBC: 8.4 10*3/uL (ref 4.0–10.5)

## 2015-02-08 LAB — TYPE AND SCREEN
ABO/RH(D): O POS
ANTIBODY SCREEN: NEGATIVE

## 2015-02-08 LAB — PROTIME-INR
INR: 1.12 (ref 0.00–1.49)
PROTHROMBIN TIME: 14.6 s (ref 11.6–15.2)

## 2015-02-08 NOTE — Pre-Procedure Instructions (Signed)
    Destiny GibbonsSherry House  02/08/2015      Your procedure is scheduled on 02/18/2015.  [facility] Ad[facility] at 9:00 A.M.                 Your surgery is at 11:05AM   Call this number if you have problems the morning of surgery:  [phone]               For any other questions, please call [phone], Monday - Friday 8 AM - 4 PM.   Remember:  Do not eat food or drink liquids after midnight Wednesday, July 27.  Take these medicines the morning of surgery with A SIP OF WATER :  Atenolol, hydrALAZINE (APRESOLINE)  Claritin. Use inhalers. Please bring Albuterol  Inhaler with you to the hospital.                May take if loratadine (CLARITIN), Maalox, Dilaudid.    Stop Plavix, Magnesium-Oxide, Goody's/BC powders, Nonsteroidal Anti-inflammatories (NSAIDS), Aspirins, Vitamins and Herbal Supplements 7 days prior to surgery.     Do not wear jewelry, make-up or nail polish.  Do not wear lotions, powders, or perfumes.    Do not shave 48 hours prior to surgery.  Do not bring valuables to the hospital.  [facility] is not responsible for any belongings or valuables.  Contacts, dentures or bridgework may not be worn into surgery.  Leave your suitcase in the car.  After surgery it may be brought to your room.  For patients admitted to the hospital, discharge time will be determined by your treatment team.  Patients discharged the day of surgery will not be allowed to drive home.    Please read over the following fact sheets that you were given. Pain Booklet, Coughing and Deep Breathing, MRSA Information and Surgical Site Infection Prevention

## 2015-02-08 NOTE — Pre-Procedure Instructions (Signed)
    Marvetta GibbonsSherry Frees  02/08/2015      [facility] - [place], [place] - [address] [place] [address] Phone: [phone] Fax: [phone]    Your procedure is scheduled on 01/28/2015 at 11:20 A.M.   [facility] Ad[facility] at 9:20 A.M.  Call this number if you have problems the morning of surgery:  [phone]   Remember:  Do not eat food or drink liquids after midnight.  Take these medicines the morning of surgery with A SIP OF WATER Atenolol, Protonix, Claritin, Maalox, Albuterol Inhaler.     Stop Plavix, Magnesium-Oxide, Goody's/BC powders, Nonsteroidal Anti-inflammatories (NSAIDS), Aspirins, Vitamins and Herbal Supplements 7 days prior to surgery.     Do not wear jewelry, make-up or nail polish.  Do not wear lotions, powders, or perfumes.  You may wear deodorant.  Do not shave 48 hours prior to surgery.  Do not bring valuables to the hospital.  [facility] is not responsible for any belongings or valuables.  Contacts, dentures or bridgework may not be worn into surgery.  Leave your suitcase in the car.  After surgery it may be brought to your room.  For patients admitted to the hospital, discharge time will be determined by your treatment team.  Patients discharged the day of surgery will not be allowed to drive home.    Please read over the following fact sheets that you were given. Pain Booklet, Coughing and Deep Breathing, MRSA Information and Surgical Site Infection Prevention

## 2015-02-08 NOTE — Progress Notes (Signed)
Anesthesia update: See last anesthesia notes from 01/20/15 and 02/05/15. Patient came in for labs today.  She reported office instructed her to hold Plavix 7 days preoperatively. Today's labs noted.  I have updated anesthesiologists Dr. Maple HudsonMoser and Dr. Krista BlueSinger regarding vascular surgeon findings and recommendations. If no acute changes then would anticipate that she could proceed as planned.  [facility] Destiny Marian, PA-C [facility] Short Stay Center/Anesthesiology Phone [phone] 02/08/2015 12:10 PM

## 2015-02-08 NOTE — Progress Notes (Addendum)
I called [facility] and spoke with Destiny House, I informed her that Destiny House has been off Plavix since approximately January 01, 2015- it was on hold for June 17th surgery which was rescheduled.  Patient reported that "no one instructed her to restart it."  I also will call her PCP Destiny House and inform her.  I instructed to take Plavix as prescribed and hold 7 days prior to surgery.    PCP is Destiny ShileyJudy Na, Destiny House ,  [facility] [place].  I called and left  a message with Destiny House for Destiny House or Destiny House, that patient had been off Plavix since approximately  01/01/15 and that she was instructed to be off 7 days, and that I instructed to to start back on Plavix and stop 7 days prior to 02/18/15 surgery.  I asked Destiny BlonderDenise to ask Destiny House to call patient if  she had any different instructions.

## 2015-02-10 ENCOUNTER — Encounter: Payer: Self-pay | Admitting: Vascular Surgery

## 2015-02-15 ENCOUNTER — Other Ambulatory Visit (HOSPITAL_COMMUNITY): Payer: Self-pay

## 2015-02-15 ENCOUNTER — Inpatient Hospital Stay (HOSPITAL_COMMUNITY)
Admission: RE | Admit: 2015-02-15 | Discharge: 2015-02-15 | Disposition: A | Discharge status: Home / Self Care | Payer: Self-pay | Source: Ambulatory Visit

## 2015-02-17 MED ORDER — DEXAMETHASONE SODIUM PHOSPHATE 10 MG/ML IJ SOLN
10.0000 mg | INTRAMUSCULAR | Status: AC
  Administered 2015-02-18: 10 mg via INTRAVENOUS
  Filled 2015-02-17: qty 1

## 2015-02-17 MED ORDER — VANCOMYCIN HCL 10 G IV SOLR
1500.0000 mg | INTRAVENOUS | Status: AC
  Administered 2015-02-18: 1500 mg via INTRAVENOUS
  Filled 2015-02-17 (×2): qty 1500

## 2015-02-18 ENCOUNTER — Inpatient Hospital Stay (HOSPITAL_COMMUNITY): Payer: Medicare Other | Admitting: Vascular Surgery

## 2015-02-18 ENCOUNTER — Inpatient Hospital Stay (HOSPITAL_COMMUNITY)
Admission: RE | Admit: 2015-02-18 | Discharge: 2015-02-20 | DRG: 460 | Disposition: A | Discharge status: Skilled Nursing Facility | Payer: Medicare Other | Source: Ambulatory Visit | Attending: Neurological Surgery | Admitting: Neurological Surgery

## 2015-02-18 ENCOUNTER — Inpatient Hospital Stay (HOSPITAL_COMMUNITY): Payer: Medicare Other | Admitting: Emergency Medicine

## 2015-02-18 ENCOUNTER — Encounter (HOSPITAL_COMMUNITY)
Admission: RE | Disposition: A | Discharge status: Skilled Nursing Facility | Payer: No Typology Code available for payment source | Source: Ambulatory Visit | Attending: Neurological Surgery

## 2015-02-18 ENCOUNTER — Encounter (HOSPITAL_COMMUNITY): Payer: Self-pay | Admitting: Neurological Surgery

## 2015-02-18 ENCOUNTER — Inpatient Hospital Stay (HOSPITAL_COMMUNITY): Payer: Medicare Other

## 2015-02-18 DIAGNOSIS — M4806 Spinal stenosis, lumbar region: Secondary | ICD-10-CM | POA: Diagnosis present

## 2015-02-18 DIAGNOSIS — Z86010 Personal history of colonic polyps: Secondary | ICD-10-CM | POA: Diagnosis not present

## 2015-02-18 DIAGNOSIS — R12 Heartburn: Secondary | ICD-10-CM | POA: Diagnosis present

## 2015-02-18 DIAGNOSIS — Z8673 Personal history of transient ischemic attack (TIA), and cerebral infarction without residual deficits: Secondary | ICD-10-CM

## 2015-02-18 DIAGNOSIS — R112 Nausea with vomiting, unspecified: Secondary | ICD-10-CM | POA: Diagnosis present

## 2015-02-18 DIAGNOSIS — Z9119 Patient's noncompliance with other medical treatment and regimen: Secondary | ICD-10-CM | POA: Diagnosis present

## 2015-02-18 DIAGNOSIS — Z888 Allergy status to other drugs, medicaments and biological substances status: Secondary | ICD-10-CM

## 2015-02-18 DIAGNOSIS — Z9981 Dependence on supplemental oxygen: Secondary | ICD-10-CM | POA: Diagnosis not present

## 2015-02-18 DIAGNOSIS — Z8672 Personal history of thrombophlebitis: Secondary | ICD-10-CM

## 2015-02-18 DIAGNOSIS — K219 Gastro-esophageal reflux disease without esophagitis: Secondary | ICD-10-CM | POA: Diagnosis present

## 2015-02-18 DIAGNOSIS — Z880 Allergy status to penicillin: Secondary | ICD-10-CM

## 2015-02-18 DIAGNOSIS — J302 Other seasonal allergic rhinitis: Secondary | ICD-10-CM | POA: Diagnosis present

## 2015-02-18 DIAGNOSIS — Z87891 Personal history of nicotine dependence: Secondary | ICD-10-CM | POA: Diagnosis not present

## 2015-02-18 DIAGNOSIS — G4700 Insomnia, unspecified: Secondary | ICD-10-CM | POA: Diagnosis present

## 2015-02-18 DIAGNOSIS — E785 Hyperlipidemia, unspecified: Secondary | ICD-10-CM | POA: Diagnosis not present

## 2015-02-18 DIAGNOSIS — M810 Age-related osteoporosis without current pathological fracture: Secondary | ICD-10-CM | POA: Diagnosis present

## 2015-02-18 DIAGNOSIS — I739 Peripheral vascular disease, unspecified: Secondary | ICD-10-CM | POA: Diagnosis present

## 2015-02-18 DIAGNOSIS — G4730 Sleep apnea, unspecified: Secondary | ICD-10-CM | POA: Diagnosis present

## 2015-02-18 DIAGNOSIS — Z79891 Long term (current) use of opiate analgesic: Secondary | ICD-10-CM

## 2015-02-18 DIAGNOSIS — Z205 Contact with and (suspected) exposure to viral hepatitis: Secondary | ICD-10-CM | POA: Diagnosis present

## 2015-02-18 DIAGNOSIS — Z981 Arthrodesis status: Secondary | ICD-10-CM

## 2015-02-18 DIAGNOSIS — Z885 Allergy status to narcotic agent status: Secondary | ICD-10-CM

## 2015-02-18 DIAGNOSIS — J449 Chronic obstructive pulmonary disease, unspecified: Secondary | ICD-10-CM | POA: Diagnosis present

## 2015-02-18 DIAGNOSIS — R600 Localized edema: Secondary | ICD-10-CM | POA: Diagnosis present

## 2015-02-18 DIAGNOSIS — Z79899 Other long term (current) drug therapy: Secondary | ICD-10-CM

## 2015-02-18 DIAGNOSIS — M4316 Spondylolisthesis, lumbar region: Secondary | ICD-10-CM | POA: Diagnosis present

## 2015-02-18 DIAGNOSIS — Z419 Encounter for procedure for purposes other than remedying health state, unspecified: Secondary | ICD-10-CM

## 2015-02-18 DIAGNOSIS — Z7902 Long term (current) use of antithrombotics/antiplatelets: Secondary | ICD-10-CM

## 2015-02-18 HISTORY — PX: MAXIMUM ACCESS (MAS)POSTERIOR LUMBAR INTERBODY FUSION (PLIF) 2 LEVEL: SHX6369

## 2015-02-18 SURGERY — FOR MAXIMUM ACCESS (MAS) POSTERIOR LUMBAR INTERBODY FUSION (PLIF) 2 LEVEL
Anesthesia: General | Site: Back

## 2015-02-18 MED ORDER — MIDAZOLAM HCL 2 MG/2ML IJ SOLN
INTRAMUSCULAR | Status: AC
  Filled 2015-02-18: qty 2

## 2015-02-18 MED ORDER — SODIUM CHLORIDE 0.9 % IR SOLN
Status: DC | PRN
  Administered 2015-02-18: 500 mL

## 2015-02-18 MED ORDER — PANTOPRAZOLE SODIUM 40 MG PO TBEC
40.0000 mg | DELAYED_RELEASE_TABLET | Freq: Every day | ORAL | Status: DC
  Administered 2015-02-19 – 2015-02-20 (×2): 40 mg via ORAL
  Filled 2015-02-18 (×2): qty 1

## 2015-02-18 MED ORDER — HYDROMORPHONE HCL 1 MG/ML IJ SOLN
INTRAMUSCULAR | Status: AC
  Filled 2015-02-18: qty 1

## 2015-02-18 MED ORDER — MENTHOL 3 MG MT LOZG
1.0000 | LOZENGE | OROMUCOSAL | Status: DC | PRN
Start: 2015-02-18 — End: 2015-02-20

## 2015-02-18 MED ORDER — ACETAMINOPHEN 325 MG PO TABS: 650.0000 mg | ORAL_TABLET | ORAL | Status: DC | PRN

## 2015-02-18 MED ORDER — FUROSEMIDE 20 MG PO TABS
20.0000 mg | ORAL_TABLET | Freq: Every day | ORAL | Status: DC
  Administered 2015-02-19 – 2015-02-20 (×2): 20 mg via ORAL
  Filled 2015-02-18 (×2): qty 1

## 2015-02-18 MED ORDER — LIDOCAINE HCL (CARDIAC) 20 MG/ML IV SOLN
INTRAVENOUS | Status: DC | PRN
  Administered 2015-02-18: 40 mg via INTRAVENOUS

## 2015-02-18 MED ORDER — METHOCARBAMOL 500 MG PO TABS
500.0000 mg | ORAL_TABLET | Freq: Four times a day (QID) | ORAL | Status: DC | PRN
  Filled 2015-02-18: qty 1

## 2015-02-18 MED ORDER — ONDANSETRON HCL 4 MG/2ML IJ SOLN
4.0000 mg | INTRAMUSCULAR | Status: DC | PRN
Start: 2015-02-18 — End: 2015-02-20
  Filled 2015-02-18: qty 2

## 2015-02-18 MED ORDER — LIDOCAINE HCL 4 % MT SOLN
OROMUCOSAL | Status: DC | PRN
  Administered 2015-02-18: 4 mL via TOPICAL

## 2015-02-18 MED ORDER — SENNA 8.6 MG PO TABS
1.0000 | ORAL_TABLET | Freq: Two times a day (BID) | ORAL | Status: DC
  Administered 2015-02-18 – 2015-02-20 (×4): 8.6 mg via ORAL
  Filled 2015-02-18 (×4): qty 1

## 2015-02-18 MED ORDER — PREGABALIN 75 MG PO CAPS
150.0000 mg | ORAL_CAPSULE | Freq: Every day | ORAL | Status: DC
  Administered 2015-02-18 – 2015-02-19 (×2): 150 mg via ORAL
  Filled 2015-02-18 (×2): qty 2

## 2015-02-18 MED ORDER — HYDROMORPHONE HCL 1 MG/ML IJ SOLN
0.5000 mg | INTRAMUSCULAR | Status: DC | PRN
  Administered 2015-02-18: 1 mg via INTRAVENOUS
  Filled 2015-02-18: qty 1

## 2015-02-18 MED ORDER — PROMETHAZINE HCL 25 MG/ML IJ SOLN: 6.2500 mg | INTRAMUSCULAR | Status: DC | PRN

## 2015-02-18 MED ORDER — METHOCARBAMOL 1000 MG/10ML IJ SOLN
500.0000 mg | Freq: Four times a day (QID) | INTRAVENOUS | Status: DC | PRN
  Administered 2015-02-18: 500 mg via INTRAVENOUS
  Filled 2015-02-18 (×2): qty 5

## 2015-02-18 MED ORDER — MIDAZOLAM HCL 5 MG/5ML IJ SOLN
INTRAMUSCULAR | Status: DC | PRN
  Administered 2015-02-18: 2 mg via INTRAVENOUS

## 2015-02-18 MED ORDER — ALBUTEROL SULFATE (2.5 MG/3ML) 0.083% IN NEBU: 3.0000 mL | INHALATION_SOLUTION | RESPIRATORY_TRACT | Status: DC | PRN

## 2015-02-18 MED ORDER — EPHEDRINE SULFATE 50 MG/ML IJ SOLN
INTRAMUSCULAR | Status: DC | PRN
  Administered 2015-02-18 (×2): 5 mg via INTRAVENOUS
  Administered 2015-02-18 (×2): 10 mg via INTRAVENOUS

## 2015-02-18 MED ORDER — PROMETHAZINE HCL 25 MG/ML IJ SOLN
INTRAMUSCULAR | Status: AC
  Filled 2015-02-18: qty 1

## 2015-02-18 MED ORDER — POTASSIUM CHLORIDE IN NACL 20-0.9 MEQ/L-% IV SOLN
INTRAVENOUS | Status: DC
  Administered 2015-02-18: 17:00:00 via INTRAVENOUS
  Administered 2015-02-19: 1000 mL via INTRAVENOUS
  Filled 2015-02-18 (×2): qty 1000

## 2015-02-18 MED ORDER — IPRATROPIUM-ALBUTEROL 0.5-2.5 (3) MG/3ML IN SOLN
3.0000 mL | Freq: Four times a day (QID) | RESPIRATORY_TRACT | Status: DC
  Administered 2015-02-18: 3 mL via RESPIRATORY_TRACT
  Filled 2015-02-18: qty 3

## 2015-02-18 MED ORDER — ACETAMINOPHEN 650 MG RE SUPP: 650.0000 mg | RECTAL | Status: DC | PRN

## 2015-02-18 MED ORDER — THROMBIN 5000 UNITS EX SOLR
CUTANEOUS | Status: DC | PRN
  Administered 2015-02-18: 10 mL via TOPICAL

## 2015-02-18 MED ORDER — PROPOFOL 10 MG/ML IV BOLUS
INTRAVENOUS | Status: AC
  Filled 2015-02-18: qty 20

## 2015-02-18 MED ORDER — SODIUM CHLORIDE 0.9 % IV SOLN: 250.0000 mL | INTRAVENOUS | Status: DC

## 2015-02-18 MED ORDER — HYDROMORPHONE HCL 2 MG PO TABS
2.0000 mg | ORAL_TABLET | ORAL | Status: DC | PRN
  Administered 2015-02-18 – 2015-02-20 (×6): 2 mg via ORAL
  Filled 2015-02-18 (×6): qty 1

## 2015-02-18 MED ORDER — PHENOL 1.4 % MT LIQD: 1.0000 | OROMUCOSAL | Status: DC | PRN

## 2015-02-18 MED ORDER — BUPIVACAINE HCL (PF) 0.25 % IJ SOLN
INTRAMUSCULAR | Status: DC | PRN
  Administered 2015-02-18: 4 mL

## 2015-02-18 MED ORDER — SODIUM CHLORIDE 0.9 % IJ SOLN: 3.0000 mL | INTRAMUSCULAR | Status: DC | PRN

## 2015-02-18 MED ORDER — ATENOLOL 25 MG PO TABS
50.0000 mg | ORAL_TABLET | Freq: Every day | ORAL | Status: DC
  Administered 2015-02-19 – 2015-02-20 (×2): 50 mg via ORAL
  Filled 2015-02-18 (×2): qty 2

## 2015-02-18 MED ORDER — ONDANSETRON HCL 4 MG/2ML IJ SOLN
INTRAMUSCULAR | Status: DC | PRN
  Administered 2015-02-18: 4 mg via INTRAVENOUS

## 2015-02-18 MED ORDER — FENTANYL CITRATE (PF) 250 MCG/5ML IJ SOLN
INTRAMUSCULAR | Status: AC
  Filled 2015-02-18: qty 5

## 2015-02-18 MED ORDER — THROMBIN 20000 UNITS EX SOLR
CUTANEOUS | Status: DC | PRN
  Administered 2015-02-18: 20 mL via TOPICAL

## 2015-02-18 MED ORDER — MEPERIDINE HCL 25 MG/ML IJ SOLN: 6.2500 mg | INTRAMUSCULAR | Status: DC | PRN

## 2015-02-18 MED ORDER — PROMETHAZINE HCL 25 MG PO TABS
25.0000 mg | ORAL_TABLET | ORAL | Status: DC | PRN
  Administered 2015-02-19 (×2): 25 mg via ORAL
  Filled 2015-02-18 (×2): qty 1

## 2015-02-18 MED ORDER — LACTATED RINGERS IV SOLN
INTRAVENOUS | Status: DC
  Administered 2015-02-18: 10:00:00 via INTRAVENOUS

## 2015-02-18 MED ORDER — CELECOXIB 200 MG PO CAPS
200.0000 mg | ORAL_CAPSULE | Freq: Two times a day (BID) | ORAL | Status: DC
  Administered 2015-02-18 – 2015-02-20 (×4): 200 mg via ORAL
  Filled 2015-02-18 (×4): qty 1

## 2015-02-18 MED ORDER — SODIUM CHLORIDE 0.9 % IJ SOLN
3.0000 mL | Freq: Two times a day (BID) | INTRAMUSCULAR | Status: DC
  Administered 2015-02-18 – 2015-02-20 (×3): 3 mL via INTRAVENOUS

## 2015-02-18 MED ORDER — DEXAMETHASONE SODIUM PHOSPHATE 4 MG/ML IJ SOLN
4.0000 mg | Freq: Four times a day (QID) | INTRAMUSCULAR | Status: DC
  Administered 2015-02-19: 4 mg via INTRAVENOUS
  Filled 2015-02-18 (×2): qty 1

## 2015-02-18 MED ORDER — HYDRALAZINE HCL 25 MG PO TABS
25.0000 mg | ORAL_TABLET | Freq: Two times a day (BID) | ORAL | Status: DC
  Administered 2015-02-18 – 2015-02-20 (×4): 25 mg via ORAL
  Filled 2015-02-18 (×4): qty 1

## 2015-02-18 MED ORDER — PHENYLEPHRINE HCL 10 MG/ML IJ SOLN
INTRAMUSCULAR | Status: DC | PRN
  Administered 2015-02-18 (×3): 120 ug via INTRAVENOUS

## 2015-02-18 MED ORDER — HYDROMORPHONE HCL 1 MG/ML IJ SOLN
0.2500 mg | INTRAMUSCULAR | Status: DC | PRN
  Administered 2015-02-18 (×2): 0.5 mg via INTRAVENOUS

## 2015-02-18 MED ORDER — PROPOFOL 10 MG/ML IV BOLUS
INTRAVENOUS | Status: DC | PRN
  Administered 2015-02-18: 150 mg via INTRAVENOUS

## 2015-02-18 MED ORDER — SUCCINYLCHOLINE CHLORIDE 20 MG/ML IJ SOLN
INTRAMUSCULAR | Status: DC | PRN
  Administered 2015-02-18: 100 mg via INTRAVENOUS

## 2015-02-18 MED ORDER — DEXAMETHASONE 4 MG PO TABS
4.0000 mg | ORAL_TABLET | Freq: Four times a day (QID) | ORAL | Status: DC
  Administered 2015-02-18 – 2015-02-20 (×7): 4 mg via ORAL
  Filled 2015-02-18 (×6): qty 1

## 2015-02-18 MED ORDER — 0.9 % SODIUM CHLORIDE (POUR BTL) OPTIME
TOPICAL | Status: DC | PRN
  Administered 2015-02-18: 1000 mL

## 2015-02-18 MED ORDER — FENTANYL CITRATE (PF) 250 MCG/5ML IJ SOLN
INTRAMUSCULAR | Status: DC | PRN
  Administered 2015-02-18 (×3): 50 ug via INTRAVENOUS
  Administered 2015-02-18: 150 ug via INTRAVENOUS
  Administered 2015-02-18 (×2): 100 ug via INTRAVENOUS

## 2015-02-18 MED ORDER — CEFAZOLIN SODIUM 1-5 GM-% IV SOLN
1.0000 g | Freq: Three times a day (TID) | INTRAVENOUS | Status: AC
  Administered 2015-02-18 – 2015-02-19 (×2): 1 g via INTRAVENOUS
  Filled 2015-02-18 (×2): qty 50

## 2015-02-18 MED FILL — Heparin Sodium (Porcine) Inj 1000 Unit/ML: INTRAMUSCULAR | Qty: 30 | Status: AC

## 2015-02-18 MED FILL — Sodium Chloride IV Soln 0.9%: INTRAVENOUS | Qty: 1000 | Status: AC

## 2015-02-18 SURGICAL SUPPLY — 74 items
BAG DECANTER FOR FLEXI CONT (MISCELLANEOUS) ×3 IMPLANT
BENZOIN TINCTURE PRP APPL 2/3 (GAUZE/BANDAGES/DRESSINGS) ×3 IMPLANT
BIT DRILL PLIF MAS 5.0MM DISP (DRILL) ×1 IMPLANT
BLADE CLIPPER SURG (BLADE) IMPLANT
BONE MATRIX OSTEOCEL PRO MED (Bone Implant) ×3 IMPLANT
BUR MATCHSTICK NEURO 3.0 LAGG (BURR) ×3 IMPLANT
CAGE COROENT PLIF 10X28-8 LUMB (Cage) ×6 IMPLANT
CAGE MAS PLIF 9X9X23-8 LUMBAR (Cage) ×6 IMPLANT
CANISTER SUCT 3000ML PPV (MISCELLANEOUS) ×3 IMPLANT
CLIP NEUROVISION LG (CLIP) ×6 IMPLANT
CLOSURE WOUND 1/2 X4 (GAUZE/BANDAGES/DRESSINGS) ×2
CONT SPEC 4OZ CLIKSEAL STRL BL (MISCELLANEOUS) ×6 IMPLANT
COVER BACK TABLE 24X17X13 BIG (DRAPES) IMPLANT
COVER BACK TABLE 60X90IN (DRAPES) ×3 IMPLANT
DRAPE C-ARM 42X72 X-RAY (DRAPES) ×3 IMPLANT
DRAPE C-ARMOR (DRAPES) ×3 IMPLANT
DRAPE LAPAROTOMY 100X72X124 (DRAPES) ×3 IMPLANT
DRAPE POUCH INSTRU U-SHP 10X18 (DRAPES) ×3 IMPLANT
DRAPE SURG 17X23 STRL (DRAPES) ×3 IMPLANT
DRILL PLIF MAS 5.0MM DISP (DRILL) ×3
DRSG OPSITE 4X5.5 SM (GAUZE/BANDAGES/DRESSINGS) ×3 IMPLANT
DRSG OPSITE POSTOP 4X6 (GAUZE/BANDAGES/DRESSINGS) ×3 IMPLANT
DRSG TELFA 3X8 NADH (GAUZE/BANDAGES/DRESSINGS) ×3 IMPLANT
DURAPREP 26ML APPLICATOR (WOUND CARE) ×3 IMPLANT
ELECT REM PT RETURN 9FT ADLT (ELECTROSURGICAL) ×3
ELECTRODE REM PT RTRN 9FT ADLT (ELECTROSURGICAL) ×1 IMPLANT
EVACUATOR 1/8 PVC DRAIN (DRAIN) ×6 IMPLANT
GAUZE SPONGE 4X4 16PLY XRAY LF (GAUZE/BANDAGES/DRESSINGS) IMPLANT
GLOVE BIO SURGEON STRL SZ8 (GLOVE) ×6 IMPLANT
GLOVE BIOGEL PI IND STRL 7.0 (GLOVE) ×2 IMPLANT
GLOVE BIOGEL PI IND STRL 8 (GLOVE) ×1 IMPLANT
GLOVE BIOGEL PI IND STRL 8.5 (GLOVE) ×1 IMPLANT
GLOVE BIOGEL PI INDICATOR 7.0 (GLOVE) ×4
GLOVE BIOGEL PI INDICATOR 8 (GLOVE) ×2
GLOVE BIOGEL PI INDICATOR 8.5 (GLOVE) ×2
GLOVE ECLIPSE 7.5 STRL STRAW (GLOVE) ×3 IMPLANT
GLOVE SS N UNI LF 7.0 STRL (GLOVE) ×12 IMPLANT
GOWN STRL REUS W/ TWL LRG LVL3 (GOWN DISPOSABLE) ×2 IMPLANT
GOWN STRL REUS W/ TWL XL LVL3 (GOWN DISPOSABLE) ×3 IMPLANT
GOWN STRL REUS W/TWL 2XL LVL3 (GOWN DISPOSABLE) IMPLANT
GOWN STRL REUS W/TWL LRG LVL3 (GOWN DISPOSABLE) ×4
GOWN STRL REUS W/TWL XL LVL3 (GOWN DISPOSABLE) ×6
HEMOSTAT POWDER KIT SURGIFOAM (HEMOSTASIS) IMPLANT
KIT BASIN OR (CUSTOM PROCEDURE TRAY) ×3 IMPLANT
KIT NEEDLE NVM5 EMG ELECT (KITS) ×1 IMPLANT
KIT NEEDLE NVM5 EMG ELECTRODE (KITS) ×2
KIT ROOM TURNOVER OR (KITS) ×3 IMPLANT
MILL MEDIUM DISP (BLADE) ×3 IMPLANT
NEEDLE HYPO 25X1 1.5 SAFETY (NEEDLE) ×3 IMPLANT
NS IRRIG 1000ML POUR BTL (IV SOLUTION) ×3 IMPLANT
PACK LAMINECTOMY NEURO (CUSTOM PROCEDURE TRAY) ×3 IMPLANT
PAD ARMBOARD 7.5X6 YLW CONV (MISCELLANEOUS) ×9 IMPLANT
ROD 60MM LUMBAR (Rod) ×6 IMPLANT
SCREW LOCK (Screw) ×12 IMPLANT
SCREW LOCK FXNS SPNE MAS PL (Screw) ×6 IMPLANT
SCREW PLIF MAS 5.0X35 (Screw) ×6 IMPLANT
SCREW SHANK 5.0X35 (Screw) ×6 IMPLANT
SCREW SHANK 6.5X40 (Screw) ×4 IMPLANT
SCREW SHANK 6.5X40 NS LF (Screw) ×2 IMPLANT
SCREW TULIP 5.5 (Screw) ×12 IMPLANT
SPONGE LAP 4X18 X RAY DECT (DISPOSABLE) IMPLANT
SPONGE SURGIFOAM ABS GEL 100 (HEMOSTASIS) ×3 IMPLANT
STRIP CLOSURE SKIN 1/2X4 (GAUZE/BANDAGES/DRESSINGS) ×4 IMPLANT
SUT VIC AB 0 CT1 18XCR BRD8 (SUTURE) ×1 IMPLANT
SUT VIC AB 0 CT1 8-18 (SUTURE) ×2
SUT VIC AB 2-0 CP2 18 (SUTURE) ×3 IMPLANT
SUT VIC AB 3-0 SH 8-18 (SUTURE) ×6 IMPLANT
SYR 20ML ECCENTRIC (SYRINGE) ×3 IMPLANT
SYR 3ML LL SCALE MARK (SYRINGE) IMPLANT
TAPE STRIPS DRAPE STRL (GAUZE/BANDAGES/DRESSINGS) ×3 IMPLANT
TOWEL OR 17X24 6PK STRL BLUE (TOWEL DISPOSABLE) ×3 IMPLANT
TOWEL OR 17X26 10 PK STRL BLUE (TOWEL DISPOSABLE) ×3 IMPLANT
TRAY FOLEY W/METER SILVER 14FR (SET/KITS/TRAYS/PACK) ×3 IMPLANT
WATER STERILE IRR 1000ML POUR (IV SOLUTION) ×3 IMPLANT

## 2015-02-18 NOTE — Progress Notes (Signed)
Pt was admitted to room 4N21. Admission vital sign is stable

## 2015-02-18 NOTE — Op Note (Signed)
02/18/2015  2:33 PM  PATIENT:  Destiny House  69 y.o. 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 Decompressive lumbar laminectomy L4-5 L5-S1 requiring more work than would be required for a simple exposure of the disk for PLIF in order to adequately decompress the neural elements and address the spinal stenosis 2. Posterior lumbar interbody fusion L4-5 L 5-S1 using PEEK interbody cages packed with morcellized allograft and autograft 3. Posterior fixation L4-S1 inclusive using cortical pedicle screws.  4. Intertransverse arthrodesis L4-S1 using morcellized autograft and allograft.  SURGEON:  Marikay Alar, MD  ASSISTANTS: Dr. Newell Coral  ANESTHESIA:  General  EBL: 350 ml  Total I/O In: 500 [IV Piggyback:500] Out: 350 [Blood:350]  BLOOD ADMINISTERED:none  DRAINS: Hemovac   INDICATION FOR PROCEDURE: This patient presented with a long history of back and leg pain. MRI showed a grade 1 spondylolisthesis L4-5 and L5-S1 with spinal stenosis. She tried medical management without relief. I recommended decompression and instrumented fusion to address her spinal stenosis and segmental instability. Patient understood the risks, benefits, and alternatives and potential outcomes and wished to proceed.  PROCEDURE DETAILS:  The patient was brought to the operating room. After induction of generalized endotracheal anesthesia the patient was rolled into the prone position on chest rolls and all pressure points were padded. The patient's lumbar region was cleaned and then prepped with DuraPrep and draped in the usual sterile fashion. Anesthesia was injected and then a dorsal midline incision was made and carried down to the lumbosacral fascia. The fascia was opened and the paraspinous musculature was taken down in a subperiosteal fashion to expose L4-S1. A  self-retaining retractor was placed. Intraoperative fluoroscopy confirmed my level, and I started with placement of the L4 cortical pedicle screws. The pedicle screw entry zones were identified utilizing surface landmarks and  AP and lateral fluoroscopy. I scored the cortex with the high-speed drill and then used the hand drill and EMG monitoring to drill an upward and outward direction into the pedicle. I then tapped line to line, and the tap was also monitored. I then placed a 5-0 x 30 mm cortical pedicle screw into the pedicles of L4 bilaterally. I then turned my attention to the decompression and the spinous process was removed and complete lumbar laminectomies, hemi- facetectomies, and foraminotomies were performed at L4-5 and L5-S1. The patient had significant spinal stenosis and this required more work than would be required for a simple exposure of the disc for posterior lumbar interbody fusion. Much more generous decompression was undertaken in order to adequately decompress the neural elements and address the patient's leg pain. The yellow ligament was removed to expose the underlying dura and nerve roots, and generous foraminotomies were performed to adequately decompress the neural elements. Both the exiting and traversing nerve roots were decompressed on both sides until a coronary dilator passed easily along the nerve roots. Once the decompression was complete, I turned my attention to the posterior lower lumbar interbody fusion. The epidural venous vasculature was coagulated and cut sharply. Disc space was incised and the initial discectomy was performed with pituitary rongeurs. The disc space was distracted with sequential distractors to a height of 10 mm at L4-5 and 9 mm at L5-S1. We then used a series of scrapers and shavers to prepare the endplates for fusion. The midline was prepared with Epstein curettes. Once the complete discectomy was finished,  we packed an appropriate sized peek interbody  cage with local autograft and morcellized allograft, gently retracted the nerve root, and tapped the cage into position at L4-5 and L5-S1.  The midline between the cages was packed with morselized autograft and allograft. We then turned our attention to the placement of the lower pedicle screws. The pedicle screw entry zones were identified utilizing surface landmarks and fluoroscopy. I drilled into each pedicle utilizing the hand drill and EMG monitoring, and tapped each pedicle with the appropriate tap. We palpated with a ball probe to assure no break in the cortex. We then placed 5-0 x 35 mm cortical pedicle screws at L4 and 6-5 x 40 mm pedicle screws into the pedicles bilaterally at S1. We then decorticated the transverse processes and laid a mixture of morcellized autograft and allograft out over these to perform intertransverse arthrodesis at L4-S1. We then placed lordotic rods into the multiaxial screw heads of the pedicle screws and locked these in position with the locking caps and anti-torque device. We then checked our construct with AP and lateral fluoroscopy. Irrigated with copious amounts of bacitracin-containing saline solution. Placed a medium Hemovac drain through separate stab incision. Inspected the nerve roots once again to assure adequate decompression, lined to the dura with Gelfoam, and closed the muscle and the fascia with 0 Vicryl. Closed the subcutaneous tissues with 2-0 Vicryl and subcuticular tissues with 3-0 Vicryl. The skin was closed with benzoin and Steri-Strips. Dressing was then applied, the patient was awakened from general anesthesia and transported to the recovery room in stable condition. At the end of the procedure all sponge, needle and instrument counts were correct.   PLAN OF CARE: Admit to inpatient   PATIENT DISPOSITION:  PACU - hemodynamically stable.   Delay start of Pharmacological VTE agent (>24hrs) due to surgical blood loss or risk of bleeding:  yes

## 2015-02-18 NOTE — Transfer of Care (Signed)
`  Immediate Anesthesia Transfer of Care Note  Patient: Destiny House  Procedure(s) Performed: Procedure(s) with comments: MAS PLIF  - L4-L5 - L5-S1  (N/A) - MAS PLIF  - L4-L5 - L5-S1   Patient Location: PACU  Anesthesia Type:General  Level of Consciousness: awake, alert  and oriented  Airway & Oxygen Therapy: Patient Spontanous Breathing and Patient connected to nasal cannula oxygen  Post-op Assessment: Report given to RN and Post -op Vital signs reviewed and stable  Post vital signs: Reviewed and stable  Last Vitals:  Filed Vitals:   02/18/15 0919  BP: 133/84  Pulse: 84  Temp: 36.8 C  Resp: 20    Complications: No apparent anesthesia complications

## 2015-02-18 NOTE — Anesthesia Preprocedure Evaluation (Addendum)
Anesthesia Evaluation  Patient identified by MRN, date of birth, ID band Patient awake    Reviewed: Allergy & Precautions, NPO status , Patient's Chart, lab work & pertinent test results  Airway Mallampati: II  TM Distance: >3 FB Neck ROM: Full    Dental no notable dental hx.    Pulmonary neg pulmonary ROS, sleep apnea , pneumonia -, COPDformer smoker,  breath sounds clear to auscultation  Pulmonary exam normal       Cardiovascular hypertension, Pt. on medications + Peripheral Vascular Disease Normal cardiovascular exam+ dysrhythmias + Valvular Problems/Murmurs Rhythm:Regular Rate:Normal     Neuro/Psych negative neurological ROS  negative psych ROS   GI/Hepatic negative GI ROS, Neg liver ROS, GERD-  ,(+) Hepatitis -  Endo/Other  negative endocrine ROS  Renal/GU CRFRenal diseasenegative Renal ROS  negative genitourinary   Musculoskeletal negative musculoskeletal ROS (+)   Abdominal   Peds negative pediatric ROS (+)  Hematology negative hematology ROS (+)   Anesthesia Other Findings   Reproductive/Obstetrics negative OB ROS                            Anesthesia Physical Anesthesia Plan  ASA: III  Anesthesia Plan: General   Post-op Pain Management:    Induction: Intravenous  Airway Management Planned: Oral ETT  Additional Equipment: Arterial line  Intra-op Plan:   Post-operative Plan: Extubation in OR  Informed Consent: I have reviewed the patients History and Physical, chart, labs and discussed the procedure including the risks, benefits and alternatives for the proposed anesthesia with the patient or authorized representative who has indicated his/her understanding and acceptance.   Dental advisory given  Plan Discussed with: CRNA  Anesthesia Plan Comments:        Anesthesia Quick Evaluation

## 2015-02-18 NOTE — Progress Notes (Signed)
Report given to [facility] shaver rn

## 2015-02-18 NOTE — Anesthesia Procedure Notes (Signed)
Procedure Name: Intubation Performed by: Mariea Clonts Preoxygenation: Pre-oxygenation with 100% oxygen Intubation Type: IV induction Ventilation: Mask ventilation without difficulty Laryngoscope Size: Miller and 2 Grade View: Grade I Tube type: Oral Tube size: 7.5 mm Number of attempts: 1 Airway Equipment and Method: LTA kit utilized Placement Confirmation: ETT inserted through vocal cords under direct vision,  breath sounds checked- equal and bilateral and positive ETCO2 Tube secured with: Tape Dental Injury: Teeth and Oropharynx as per pre-operative assessment

## 2015-02-18 NOTE — H&P (Signed)
Subjective: Patient is a 69 y.o. female admitted for PLIF. Onset of symptoms was several months ago, gradually worsening since that time.  The pain is rated severe, and is located at the across the lower back and radiates to legs. The pain is described as aching and occurs all day. The symptoms have been progressive. Symptoms are exacerbated by exercise. MRI or CT showed spondylolisthesis L4-5, L5-S1 .  Past Medical History  Diagnosis Date  . Hyperlipidemia     takes Simvastatin daily  . Nausea and vomiting     takes Phenergan daily as needed   . Seasonal allergies     takes Claritin daily as needed  . GERD (gastroesophageal reflux disease)     takes Protonix daily  . Heartburn     takes Mylanta daily as needed  . Insomnia     takes Sominex nightly as needed  . Hypertension     takes Atenolol daily as well HCTZ  . Peripheral edema     takes Lasix daily  . Phlebitis in her 20's  . COPD (chronic obstructive pulmonary disease)   . Emphysema lung   . Pneumonia     hx of;last time 44yrs ago   . Headache(784.0)     occasionally  . TIA (transient ischemic attack)     takes Plavix daily  . Arthritis   . Osteoarthritis   . Osteoporosis   . Chronic back pain     spondylolisthesis  . History of colon polyps   . Depression     but doesn't take any meds  . History of Clostridium difficile > 1 yr ago  . Peripheral vascular disease     thrombophlebitis RLE in her 20's  . 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 Seizures     A few years ago; was on Keppra but no longer taking (over a year) no seizures since.  Marland Kitchen Dysrhythmia     asymptomatic; occasional  . Heart murmur   . Anxiety   . Chronic kidney disease   . Infection 2015    of esophagus  . On home oxygen therapy     sometimes wears oxygen at night  . Sleep apnea     noncompliant with CPAP (has at home). Currently wears 2L of O2 at night on occasion.      Past Surgical History  Procedure Laterality Date  . Tubal ligation  at age 25  . Cholecystectomy  11 yrs ago  . Colonoscopy    . Esophagogastroduodenoscopy    . Cardiac catheterization  2015  . Wisdom tooth extraction    . Multiple tooth extractions  2010  . Diagnostic laparoscopy    . Spinal cord stimulator insertion  2012  . Spinal cord stimulator removal  2015    Prior to Admission medications   Medication Sig Start Date End Date Taking? Authorizing Provider  albuterol (PROAIR HFA) 108 (90 BASE) MCG/ACT inhaler Inhale 2 puffs into the lungs every 4 (four) hours as needed for wheezing or shortness of breath.  11/06/13  Yes Historical Provider, MD  alum & mag hydroxide-simeth (MAALOX/MYLANTA) 200-200-20 MG/5ML suspension Take 30 mLs by mouth every 6 (six) hours as needed for indigestion or heartburn.   Yes Historical Provider, MD  atenolol (TENORMIN) 50 MG tablet Take 50 mg by mouth daily.   Yes Historical Provider, MD  clopidogrel (PLAVIX) 75 MG tablet Take  75 mg by mouth daily with breakfast.   Yes Historical Provider, MD  COMBIVENT RESPIMAT 20-100 MCG/ACT AERS respimat Inhale 1 puff into the lungs 4 (four) times daily. 01/11/15  Yes Historical Provider, MD  diphenhydrAMINE (SOMINEX) 25 MG tablet Take 25 mg by mouth every 6 (six) hours as needed for itching or sleep.   Yes Historical Provider, MD  furosemide (LASIX) 20 MG tablet Take 20 mg by mouth daily.    Yes Historical Provider, MD  hydrALAZINE (APRESOLINE) 25 MG tablet Take 25 mg by mouth 2 (two) times daily.   Yes Historical Provider, MD  HYDROmorphone (DILAUDID) 2 MG tablet Take 2 mg by mouth every 8 (eight) hours as needed (pain).  01/05/15  Yes Historical Provider, MD  loratadine (CLARITIN) 10 MG tablet Take 10 mg by mouth daily as needed for allergies.   Yes Historical Provider, MD  LYRICA 150 MG capsule Take 150 mg by mouth at bedtime. 01/15/15  Yes Historical Provider, MD  magnesium oxide (MAG-OX) 400 MG tablet Take 400 mg by  mouth 2 (two) times daily.   Yes Historical Provider, MD  pantoprazole (PROTONIX) 40 MG tablet Take 40 mg by mouth daily.   Yes Historical Provider, MD  promethazine (PHENERGAN) 25 MG tablet Take 25 mg by mouth every 4 (four) hours as needed for nausea or vomiting.   Yes Historical Provider, MD  simvastatin (ZOCOR) 80 MG tablet Take 80 mg by mouth daily.   Yes Historical Provider, MD   Allergies  Allergen Reactions  . Benicar [Olmesartan]     Headache  . Gabapentin Nausea And Vomiting    Itching  . Hydrocodone Nausea And Vomiting    Itching  . Nystatin Itching  . Oxycodone Itching  . Talwin [Pentazocine] Nausea And Vomiting    Itching  . Vistaril [Hydroxyzine] Itching  . Penicillins Rash    History  Substance Use Topics  . Smoking status: Former Smoker -- 0.25 packs/day for 35 years    Types: Cigarettes    Quit date: 09/22/2014  . Smokeless tobacco: Not on file  . Alcohol Use: No    History reviewed. No pertinent family history.   Review of Systems  Positive ROS: neg  All other systems have been reviewed and were otherwise negative with the exception of those mentioned in the HPI and as above.  Objective: Vital signs in last 24 hours: Temp:  [98.3 F (36.8 C)] 98.3 F (36.8 C) (07/28 0919) Pulse Rate:  [84] 84 (07/28 0919) Resp:  [20] 20 (07/28 0919) BP: (133)/(84) 133/84 mmHg (07/28 0919) SpO2:  [99 %] 99 % (07/28 0919) Weight:  [162 lb (73.483 kg)] 162 lb (73.483 kg) (07/28 0919)  General Appearance: Alert, cooperative, no distress, appears stated age Head: Normocephalic, without obvious abnormality, atraumatic Eyes: PERRL, conjunctiva/corneas clear, EOM's intact    Neck: Supple, symmetrical, trachea midline Back: Symmetric, no curvature, ROM normal, no CVA tenderness Lungs:  respirations unlabored Heart: Regular rate and rhythm Abdomen: Soft, non-tender Extremities: Extremities normal, atraumatic, no cyanosis or edema Pulses: 2+ and symmetric all  extremities Skin: Skin color, texture, turgor normal, no rashes or lesions  NEUROLOGIC:   Mental status: Alert and oriented x4,  no aphasia, good attention span, fund of knowledge, and memory Motor Exam - grossly normal Sensory Exam - grossly normal Reflexes: 1+ Coordination - grossly normal Gait - grossly normal Balance - grossly normal Cranial Nerves: I: smell Not tested  II: visual acuity  OS: nl    OD: nl  II: visual fields Full to confrontation  II: pupils Equal, round, reactive to light  III,VII: ptosis None  III,IV,VI: extraocular muscles  Full ROM  V: mastication Normal  V: facial light touch sensation  Normal  V,VII: corneal reflex  Present  VII: facial muscle function - upper  Normal  VII: facial muscle function - lower Normal  VIII: hearing Not tested  IX: soft palate elevation  Normal  IX,X: gag reflex Present  XI: trapezius strength  5/5  XI: sternocleidomastoid strength 5/5  XI: neck flexion strength  5/5  XII: tongue strength  Normal    Data Review Lab Results  Component Value Date   WBC 8.4 02/08/2015   HGB 13.9 02/08/2015   HCT 42.2 02/08/2015   MCV 83.7 02/08/2015   PLT 181 02/08/2015   Lab Results  Component Value Date   NA 140 02/08/2015   K 5.0 02/08/2015   CL 108 02/08/2015   CO2 24 02/08/2015   BUN 18 02/08/2015   CREATININE 1.34* 02/08/2015   GLUCOSE 88 02/08/2015   Lab Results  Component Value Date   INR 1.12 02/08/2015    Assessment/Plan: Patient admitted for PLIF. Patient has failed a reasonable attempt at conservative therapy.  I explained the condition and procedure to the patient and answered any questions.  Patient wishes to proceed with procedure as planned. Understands risks/ benefits and typical outcomes of procedure.   Gaynelle Pastrana S 02/18/2015 9:47 AM

## 2015-02-18 NOTE — Progress Notes (Signed)
A line d/c with cath tip intact pressure helt by 5 min and pressure bandage applies

## 2015-02-19 ENCOUNTER — Encounter (HOSPITAL_COMMUNITY): Payer: Self-pay | Admitting: Neurological Surgery

## 2015-02-19 MED ORDER — ALUM & MAG HYDROXIDE-SIMETH 200-200-20 MG/5ML PO SUSP
30.0000 mL | Freq: Four times a day (QID) | ORAL | Status: DC | PRN
  Administered 2015-02-19 – 2015-02-20 (×4): 30 mL via ORAL
  Filled 2015-02-19 (×4): qty 30

## 2015-02-19 MED ORDER — IPRATROPIUM-ALBUTEROL 0.5-2.5 (3) MG/3ML IN SOLN
3.0000 mL | Freq: Two times a day (BID) | RESPIRATORY_TRACT | Status: DC
  Administered 2015-02-19 – 2015-02-20 (×2): 3 mL via RESPIRATORY_TRACT
  Filled 2015-02-19 (×2): qty 3

## 2015-02-19 MED ORDER — IPRATROPIUM-ALBUTEROL 0.5-2.5 (3) MG/3ML IN SOLN
3.0000 mL | Freq: Four times a day (QID) | RESPIRATORY_TRACT | Status: DC
  Administered 2015-02-19: 3 mL via RESPIRATORY_TRACT
  Filled 2015-02-19: qty 3

## 2015-02-19 NOTE — Progress Notes (Signed)
OT NOTE  Order in order set for brace currently. NO brace present in room. Please follow order set instructions to call ortho tech [phone])  to order brace. OT to return to complete evaluation upon brace arrival.   Shanaye Rief, Brynn   OTR/L Pager: [phone] Office: [phone] .   

## 2015-02-19 NOTE — Progress Notes (Signed)
Orthopedic Tech Progress Note Patient Details:  Destiny House [birth year] [id number]  Patient ID: Destiny House, female   DOB: 1945/11/11, 69 y.o.   MRN: [id number] Called in bio-tech brace order; spoke with [name], Graclyn Lawther 02/19/2015, 9:21 AM

## 2015-02-19 NOTE — Clinical Social Work Placement (Signed)
   CLINICAL SOCIAL WORK PLACEMENT  NOTE  Date:  02/19/2015  Patient Details  Name: Tabita Corbo MRN: [id number] Date of Birth: 01/12/1946  Clinical Social Work is seeking post-discharge placement for this patient at the Skilled  Nursing Facility level of care (*CSW will initial, date and re-position this form in  chart as items are completed):  Yes   Patient/family provided with [facility] Clinical Social Work Department's list of facilities offering this level of care within the geographic area requested by the patient (or if unable, by the patient's family).  Yes   Patient/family informed of their freedom to choose among providers that offer the needed level of care, that participate in Medicare, Medicaid or managed care program needed by the patient, have an available bed and are willing to accept the patient.  Yes   Patient/family informed of [facility]'s ownership interest in [facility] and [facility], as well as of the fact that they are under no obligation to receive care at these facilities.  PASRR submitted to EDS on       PASRR number received on       Existing PASRR number confirmed on 02/19/15     FL2 transmitted to all facilities in geographic area requested by pt/family on 02/19/15     FL2 transmitted to all facilities within larger geographic area on       Patient informed that his/her managed care company has contracts with or will negotiate with certain facilities, including the following:            Patient/family informed of bed offers received.  Patient chooses bed at       Physician recommends and patient chooses bed at      Patient to be transferred to   on  .  Patient to be transferred to facility by       Patient family notified on   of transfer.  Name of family member notified:        PHYSICIAN       Additional Comment:    _______________________________________________ Gwynne Edinger, LCSW 02/19/2015, 2:31 PM

## 2015-02-19 NOTE — Clinical Social Work Note (Signed)
Clinical Social Work Assessment  Patient Details  Name: Destiny House MRN: [id number] Date of Birth: 1946/02/26  Date of referral:  02/19/15               Reason for consult:  Facility Placement              Housing/Transportation Living arrangements for the past 2 months:  Apartment Source of Information:  Patient Patient Interpreter Needed:  None Criminal Activity/Legal Involvement Pertinent to Current Situation/Hospitalization:  No - Comment as needed Significant Relationships:  Adult Children Lives with:  Self Do you feel safe going back to the place where you live?  No Need for family participation in patient care:  No (Coment)  Care giving concerns:  N/A  Facilities manager / plan:  CSW met the pt at bedside. CSW introduced self and purpose of the visit. CSW discussed SNF rehab. CSW explained the SNF process. CSW explained insurance and its relation to SNF placement. CSW and pt reviewed SNF list. CSW and pt discussed geographic location in which the she would like to receive rehab. The pt reported that she will like to remain in [place]. CSW answered all questions in which the pt inquired about. CSW will continue to follow this pt and assist with discharge as needed.   Employment status:  Retired Nurse, adult PT Recommendations:  [place] / Referral to community resources:  [place]  Patient/Family's Response to care:  Pt shared that the care in which she received has been well.   Patient/Family's Understanding of and Emotional Response to Diagnosis, Current Treatment, and Prognosis: Pt acknowledged her diagnosis and her current treatment. Pt acknowledged that she will need rehab prior to returning home.   Emotional Assessment Appearance:  Appears stated age Attitude/Demeanor/Rapport:   (positive ) Affect (typically observed):  Pleasant, Accepting Orientation:  Oriented to Self, Oriented to  Place, Oriented to  Time, Oriented to Situation Alcohol / Substance use:  Not Applicable Psych involvement (Current and /or in the community):  No (Comment)  Discharge Needs  Concerns to be addressed:  Denies Needs/Concerns at this time Readmission within the last 30 days:  No Current discharge risk:  None Barriers to Discharge:  No Barriers Identified   Joshua Soulier, LCSW 02/19/2015, 2:07 PM

## 2015-02-19 NOTE — Anesthesia Postprocedure Evaluation (Signed)
Anesthesia Post Note  Patient: Destiny House  Procedure(s) Performed: Procedure(s) (LRB): MAS PLIF  - L4-L5 - L5-S1  (N/A)  Anesthesia type: General  Patient location: PACU  Post pain: Pain level controlled  Post assessment: Post-op Vital signs reviewed  Last Vitals: BP 111/76 mmHg  Pulse 90  Temp(Src) 37.2 C (Oral)  Resp 19  Wt 162 lb (73.483 kg)  SpO2 97%  Post vital signs: Reviewed  Level of consciousness: sedated  Complications: No apparent anesthesia complications

## 2015-02-19 NOTE — Progress Notes (Signed)
Utilization review completed. Rewa Weissberg, RN, BSN. 

## 2015-02-19 NOTE — Evaluation (Signed)
Physical Therapy Evaluation Patient Details Name: Destiny House MRN: [id number] DOB: Dec 13, 1945 Today's Date: 02/19/2015   History of Present Illness  69 y.o. female s/p PLIF L4-5 L5-S1. PMH: HTN, hyperlipidemia, peripheral edema, COPD, TIA, seizures, arthritis  Clinical Impression  Patient is s/p above surgery resulting in the deficits listed below (see PT Problem List). Patient will benefit from skilled PT to increase their independence and safety with mobility (while adhering to their precautions) to allow discharge to the venue listed below.    Follow Up Recommendations SNF    Equipment Recommendations  None recommended by PT (Patient reports having equipment)    Recommendations for Other Services       Precautions / Restrictions Precautions Precautions: Back Precaution Booklet Issued: Yes (comment) Precaution Comments: education and handout provided Required Braces or Orthoses: Spinal Brace Spinal Brace: Applied in sitting position Restrictions Weight Bearing Restrictions: No      Mobility  Bed Mobility Overal bed mobility: Needs Assistance Bed Mobility: Rolling;Sidelying to Sit;Sit to Sidelying Rolling: Min assist Sidelying to sit: Min assist     Sit to sidelying: Min assist General bed mobility comments: cues for log roll, verbal + tactile  Transfers Overall transfer level: Needs assistance Equipment used: Rolling walker (2 wheeled) Transfers: Sit to/from Stand Sit to Stand: Min guard            Ambulation/Gait Ambulation/Gait assistance: Min guard Ambulation Distance (Feet): 50 Feet Assistive device: Rolling walker (2 wheeled) Gait Pattern/deviations: Step-through pattern Gait velocity: slow pattern      Stairs            Wheelchair Mobility    Modified Rankin (Stroke Patients Only)       Balance Overall balance assessment: Needs assistance Sitting-balance support: No upper extremity supported Sitting balance-Leahy Scale: Fair     Standing balance support: Single extremity supported Standing balance-Leahy Scale: Poor                               Pertinent Vitals/Pain Pain Assessment: 0-10 Pain Score: 5  Pain Location: back Pain Descriptors / Indicators: Other (Comment) (hurts) Pain Intervention(s): Monitored during session;Repositioned    Home Living Family/patient expects to be discharged to:: Skilled nursing facility Living Arrangements: Alone Available Help at Discharge: Other (Comment) (None) Type of Home: Apartment Home Access: Other (comment) (reports level entry)   Entrance Stairs-Number of Steps: "a couple" Home Layout: One level Home Equipment: Walker - 4 wheels;Cane - single point      Prior Function Level of Independence: Independent               Hand Dominance   Dominant Hand: Right    Extremity/Trunk Assessment   Upper Extremity Assessment: Overall WFL for tasks assessed           Lower Extremity Assessment: Generalized weakness         Communication   Communication: No difficulties  Cognition Arousal/Alertness: Awake/alert Behavior During Therapy: WFL for tasks assessed/performed Overall Cognitive Status: Within Functional Limits for tasks assessed       Memory: Decreased recall of precautions              General Comments      Exercises        Assessment/Plan    PT Assessment Patient needs continued PT services  PT Diagnosis Difficulty walking;Generalized weakness   PT Problem List Decreased strength;Decreased activity tolerance;Decreased range of motion;Decreased balance;Decreased mobility;Decreased knowledge of use  of DME;Decreased safety awareness;Decreased knowledge of precautions  PT Treatment Interventions DME instruction;Gait training;Stair training;Functional mobility training;Therapeutic activities;Therapeutic exercise;Patient/family education   PT Goals (Current goals can be found in the Care Plan section) Acute Rehab PT  Goals Patient Stated Goal: eventually get home PT Goal Formulation: With patient Time For Goal Achievement: 03/05/15 Potential to Achieve Goals: Good    Frequency Min 4X/week   Barriers to discharge        Co-evaluation               End of Session Equipment Utilized During Treatment: Gait belt;Back brace;Oxygen Activity Tolerance: Patient tolerated treatment well Patient left: in bed;with call bell/phone within reach;with SCD's reapplied Nurse Communication: Mobility status         Time: 1610-9604 PT Time Calculation (min) (ACUTE ONLY): 30 min   Charges:   PT Evaluation $Initial PT Evaluation Tier I: 1 Procedure PT Treatments $Gait Training: 8-22 mins   PT G Codes:        Christiane Ha, PT, CSCS Pager [phone] Office [phone]  02/19/2015, 2:22 PM

## 2015-02-19 NOTE — Evaluation (Signed)
Occupational Therapy Evaluation Patient Details Name: Destiny House MRN: [id number] DOB: [birth year] Today's Date: 02/19/2015    History of Present Illness 69 y.o. female s/p PLIF L4-5 L5-S1. PMH: HTN, hyperlipidemia, peripheral edema, COPD, TIA, seizures, arthritis   Clinical Impression   Patient is s/p PLIF L4-5 L5-S1 surgery resulting in functional limitations due to the deficits listed below (see OT problem list). Education and handout provided on back precautions; pt requires VC's to maintain precautions during ADLs. Pt demonstrates ability to don brace with min (A).  Patient will benefit from skilled OT acutely to increase independence and safety with ADLS prior to d/c. Pt requesting d/c to SNF for rehab due to no caregiver support and fear of falling at home.     Follow Up Recommendations  SNF    Equipment Recommendations  None recommended by OT    Recommendations for Other Services       Precautions / Restrictions Precautions Precautions: Back Precaution Booklet Issued: Yes (comment) Precaution Comments: education and handout provided Required Braces or Orthoses: Spinal Brace Spinal Brace: Lumbar corset;Applied in sitting position Restrictions Weight Bearing Restrictions: No      Mobility Bed Mobility Overal bed mobility: Needs Assistance Bed Mobility: Rolling;Sidelying to Sit Rolling: Supervision Sidelying to sit: Supervision       General bed mobility comments: VC for technique  Transfers Overall transfer level: Needs assistance Equipment used: Rolling walker (2 wheeled) Transfers: Sit to/from Stand Sit to Stand: Min guard;From elevated surface              Balance Overall balance assessment: Needs assistance;History of Falls Sitting-balance support: No upper extremity supported;Feet supported Sitting balance-Leahy Scale: Good     Standing balance support: Single extremity supported;During functional activity Standing balance-Leahy Scale:  Fair                              ADL Overall ADL's : Needs assistance/impaired Eating/Feeding: Independent;Sitting   Grooming: Wash/dry hands;Wash/dry face;Oral care;Supervision/safety;Standing (VC's to maintain precautions)   Upper Body Bathing: Minimal assitance;Sitting   Lower Body Bathing: Minimal assistance;Cueing for back precautions;Sit to/from stand   Upper Body Dressing : Minimal assistance;Sitting (don brace)   Lower Body Dressing: Minimal assistance;Cueing for back precautions;Sit to/from stand   Toilet Transfer: Min guard;BSC;Ambulation   Toileting- Architect and Hygiene: Modified independent;Sit to/from stand       Functional mobility during ADLs: Min guard;Rolling walker       Vision Vision Assessment?: No apparent visual deficits   Perception     Praxis      Pertinent Vitals/Pain Pain Assessment: No/denies pain     Hand Dominance Right   Extremity/Trunk Assessment Upper Extremity Assessment Upper Extremity Assessment: Overall WFL for tasks assessed   Lower Extremity Assessment Lower Extremity Assessment: Overall WFL for tasks assessed       Communication Communication Communication: No difficulties   Cognition Arousal/Alertness: Awake/alert Behavior During Therapy: WFL for tasks assessed/performed Overall Cognitive Status: Within Functional Limits for tasks assessed       Memory: Decreased recall of precautions             General Comments       Exercises       Shoulder Instructions      Home Living Family/patient expects to be discharged to:: Private residence Living Arrangements: Alone Available Help at Discharge: Other (Comment) (None) Type of Home: Apartment Home Access: Stairs to enter Entrance Stairs-Number of Steps: "a couple"  Home Layout: One level     Bathroom Shower/Tub: Tub/shower unit;Curtain Shower/tub characteristics: Engineer, building services: Standard     Home Equipment:  Environmental consultant - 2 wheels;Cane - single point;Bedside commode;Shower seat;Hand held shower head          Prior Functioning/Environment Level of Independence: Independent             OT Diagnosis: Generalized weakness;Acute pain   OT Problem List: Decreased strength;Decreased activity tolerance;Impaired balance (sitting and/or standing);Decreased safety awareness;Decreased knowledge of use of DME or AE;Decreased knowledge of precautions;Pain   OT Treatment/Interventions: Self-care/ADL training;Therapeutic exercise;DME and/or AE instruction;Therapeutic activities;Energy conservation;Patient/family education;Balance training    OT Goals(Current goals can be found in the care plan section) Acute Rehab OT Goals Patient Stated Goal: to get out of bed OT Goal Formulation: With patient Time For Goal Achievement: 03/05/15 Potential to Achieve Goals: Good ADL Goals Pt Will Perform Grooming: standing (maintain precautions) Pt Will Perform Upper Body Dressing: with supervision;sitting Pt Will Perform Lower Body Dressing: with supervision;sit to/from stand Pt Will Transfer to Toilet: with supervision;ambulating;bedside commode Additional ADL Goal #1: Pt will verbalize 3/3 precautions as precursor to ADLs.  OT Frequency: Min 2X/week   Barriers to D/C: Decreased caregiver support  Pt reports no one available to assist upon d/c; requesing SNF for rehab       Co-evaluation              End of Session Equipment Utilized During Treatment: Gait belt;Rolling walker;Back brace;Oxygen Nurse Communication: Mobility status;Precautions  Activity Tolerance: Patient tolerated treatment well;No increased pain Patient left: in chair;with call bell/phone within reach;with nursing/sitter in room   Time: 1055-1119 OT Time Calculation (min): 24 min Charges:  OT General Charges $OT Visit: 1 Procedure OT Evaluation $Initial OT Evaluation Tier I: 1 Procedure OT Treatments $Self Care/Home Management :  8-22 mins G-Codes:    Marden Noble 02/19/2015, 11:59 AM

## 2015-02-19 NOTE — Discharge Summary (Signed)
Physician Discharge Summary  Patient ID: Destiny House MRN: [id number] DOB/AGE: 08/27/1945 69 y.o.  Admit date: 02/18/2015 Discharge date: 02/19/2015  Admission Diagnoses: spondylolisthesis/ stenosis L4-5, L5-S1   Discharge Diagnoses: same   Discharged Condition: good  Hospital Course: The patient was admitted on 02/18/2015 and taken to the operating room where the patient underwent PLIF L4-5, L5-S1. The patient tolerated the procedure well and was taken to the recovery room and then to the floor in stable condition. The hospital course was routine. There were no complications. The wound remained clean dry and intact. Pt had appropriate back soreness. No complaints of leg pain or new N/T/W. The patient remained afebrile with stable vital signs, and tolerated a regular diet. The patient continued to increase activities, and pain was well controlled with oral pain medications.   Consults: None  Significant Diagnostic Studies:  Results for orders placed or performed during the hospital encounter of 02/08/15  Basic metabolic panel  Result Value Ref Range   Sodium 140 135 - 145 mmol/L   Potassium 5.0 3.5 - 5.1 mmol/L   Chloride 108 101 - 111 mmol/L   CO2 24 22 - 32 mmol/L   Glucose, Bld 88 65 - 99 mg/dL   BUN 18 6 - 20 mg/dL   Creatinine, Ser 4.09 (H) 0.44 - 1.00 mg/dL   Calcium 9.6 8.9 - 81.1 mg/dL   GFR calc non Af Amer 39 (L) >60 mL/min   GFR calc Af Amer 45 (L) >60 mL/min   Anion gap 8 5 - 15  CBC  Result Value Ref Range   WBC 8.4 4.0 - 10.5 K/uL   RBC 5.04 3.87 - 5.11 MIL/uL   Hemoglobin 13.9 12.0 - 15.0 g/dL   HCT 91.4 78.2 - 95.6 %   MCV 83.7 78.0 - 100.0 fL   MCH 27.6 26.0 - 34.0 pg   MCHC 32.9 30.0 - 36.0 g/dL   RDW 21.3 (H) 08.6 - 57.8 %   Platelets 181 150 - 400 K/uL  PT- INR at PAT visit (Pre-admission Testing)  Result Value Ref Range   Prothrombin Time 14.6 11.6 - 15.2 seconds   INR 1.12 0.00 - 1.49  Hepatic function panel  Result Value Ref Range   Total  Protein 7.2 6.5 - 8.1 g/dL   Albumin 3.7 3.5 - 5.0 g/dL   AST 16 15 - 41 U/L   ALT 11 (L) 14 - 54 U/L   Alkaline Phosphatase 68 38 - 126 U/L   Total Bilirubin 0.5 0.3 - 1.2 mg/dL   Bilirubin, Direct <4.6 (L) 0.1 - 0.5 mg/dL   Indirect Bilirubin NOT CALCULATED 0.3 - 0.9 mg/dL  Type and screen  Result Value Ref Range   ABO/RH(D) O POS    Antibody Screen NEG    Sample Expiration 02/22/2015     Dg Lumbar Spine 2-3 Views  02/18/2015   CLINICAL DATA:  Lumbar disc disease.  EXAM: DG C-ARM 61-120 MIN; LUMBAR SPINE - 2-3 VIEW  COMPARISON:  MRI dated 01/16/2015  FINDINGS: AP and lateral C-arm images demonstrate the patient has undergone interbody and posterior fusion at L4-5 and L5-S1. Hardware appears in good position in the AP and lateral projections.  IMPRESSION: Fusions performed at L4-5 and L5-S1.   Electronically Signed   By: Francene Boyers M.D.   On: 02/18/2015 14:24   Dg C-arm 1-60 Min  02/18/2015   CLINICAL DATA:  Lumbar disc disease.  EXAM: DG C-ARM 61-120 MIN; LUMBAR SPINE - 2-3 VIEW  COMPARISON:  MRI dated 01/16/2015  FINDINGS: AP and lateral C-arm images demonstrate the patient has undergone interbody and posterior fusion at L4-5 and L5-S1. Hardware appears in good position in the AP and lateral projections.  IMPRESSION: Fusions performed at L4-5 and L5-S1.   Electronically Signed   By: Francene Boyers M.D.   On: 02/18/2015 14:24    Antibiotics:  Anti-infectives    Start     Dose/Rate Route Frequency Ordered Stop   02/18/15 1700  ceFAZolin (ANCEF) IVPB 1 g/50 mL premix     1 g 100 mL/hr over 30 Minutes Intravenous Every 8 hours 02/18/15 1620 02/19/15 0130   02/18/15 1041  bacitracin 50,000 Units in sodium chloride irrigation 0.9 % 500 mL irrigation  Status:  Discontinued       As needed 02/18/15 1041 02/18/15 1431   02/18/15 1000  vancomycin (VANCOCIN) 1,500 mg in sodium chloride 0.9 % 500 mL IVPB     1,500 mg 250 mL/hr over 120 Minutes Intravenous To [facility] 02/17/15  0931 02/18/15 1132      Discharge Exam: Blood pressure 160/80, pulse 75, temperature 99.3 F (37.4 C), temperature source Oral, resp. rate 28, weight 162 lb (73.483 kg), SpO2 98 %. Neurologic: Grossly normal Incision CCI  Discharge Medications:     Medication List    TAKE these medications        alum & mag hydroxide-simeth 200-200-20 MG/5ML suspension  Commonly known as:  MAALOX/MYLANTA  Take 30 mLs by mouth every 6 (six) hours as needed for indigestion or heartburn.     atenolol 50 MG tablet  Commonly known as:  TENORMIN  Take 50 mg by mouth daily.     clopidogrel 75 MG tablet  Commonly known as:  PLAVIX  Take 75 mg by mouth daily with breakfast.     COMBIVENT RESPIMAT 20-100 MCG/ACT Aers respimat  Generic drug:  Ipratropium-Albuterol  Inhale 1 puff into the lungs 4 (four) times daily.     diphenhydrAMINE 25 MG tablet  Commonly known as:  SOMINEX  Take 25 mg by mouth every 6 (six) hours as needed for itching or sleep.     furosemide 20 MG tablet  Commonly known as:  LASIX  Take 20 mg by mouth daily.     hydrALAZINE 25 MG tablet  Commonly known as:  APRESOLINE  Take 25 mg by mouth 2 (two) times daily.     HYDROmorphone 2 MG tablet  Commonly known as:  DILAUDID  Take 2 mg by mouth every 8 (eight) hours as needed (pain).     loratadine 10 MG tablet  Commonly known as:  CLARITIN  Take 10 mg by mouth daily as needed for allergies.     LYRICA 150 MG capsule  Generic drug:  pregabalin  Take 150 mg by mouth at bedtime.     magnesium oxide 400 MG tablet  Commonly known as:  MAG-OX  Take 400 mg by mouth 2 (two) times daily.     pantoprazole 40 MG tablet  Commonly known as:  PROTONIX  Take 40 mg by mouth daily.     PROAIR HFA 108 (90 BASE) MCG/ACT inhaler  Generic drug:  albuterol  Inhale 2 puffs into the lungs every 4 (four) hours as needed for wheezing or shortness of breath.     promethazine 25 MG tablet  Commonly known as:  PHENERGAN  Take 25 mg by  mouth every 4 (four) hours as needed for nausea or vomiting.  simvastatin 80 MG tablet  Commonly known as:  ZOCOR  Take 80 mg by mouth daily.        Disposition: SNF   Final Dx: PLIF L4-5, L5-S1      Discharge Instructions     Remove dressing in 72 hours    Complete by:  As directed      Call MD for:  difficulty breathing, headache or visual disturbances    Complete by:  As directed      Call MD for:  persistant nausea and vomiting    Complete by:  As directed      Call MD for:  redness, tenderness, or signs of infection (pain, swelling, redness, odor or green/yellow discharge around incision site)    Complete by:  As directed      Call MD for:  severe uncontrolled pain    Complete by:  As directed      Call MD for:  temperature >100.4    Complete by:  As directed      Diet - low sodium heart healthy    Complete by:  As directed      Discharge instructions    Complete by:  As directed   No bending or twisting, no driving, no strenuous activity     Increase activity slowly    Complete by:  As directed            Follow-up Information    Follow up with Havanah Nelms S, MD. Schedule an appointment as soon as possible for a visit in 3 weeks.   Specialty:  Neurosurgery   Contact information:   [address] [place] [address] [phone]        Signed: Tia Alert 02/19/2015, 4:54 PM

## 2015-02-19 NOTE — Progress Notes (Signed)
Subjective: Patient reports she's doing well, pain much better than pre-op. No NTW  Objective: Vital signs in last 24 hours: Temp:  [97.8 F (36.6 C)-99 F (37.2 C)] 98.2 F (36.8 C) (07/29 0947) Pulse Rate:  [83-100] 100 (07/29 0947) Resp:  [14-23] 20 (07/29 0947) BP: (96-138)/(52-95) 128/80 mmHg (07/29 0947) SpO2:  [94 %-100 %] 97 % (07/29 0947) Arterial Line BP: (112)/(66) 112/66 mmHg (07/28 1435)  Intake/Output from previous day: 07/28 0730 - 07/29 0729 In: 1620 [I.V.:1120; IV Piggyback:500] Out: 1705 [Urine:1150; Drains:205; Blood:350] Intake/Output this shift: Total I/O In: 3 [I.V.:3] Out: 480 [Urine:350; Drains:130]  Neurologic: Grossly normal  Lab Results: Lab Results  Component Value Date   WBC 8.4 02/08/2015   HGB 13.9 02/08/2015   HCT 42.2 02/08/2015   MCV 83.7 02/08/2015   PLT 181 02/08/2015   Lab Results  Component Value Date   INR 1.12 02/08/2015   BMET Lab Results  Component Value Date   NA 140 02/08/2015   K 5.0 02/08/2015   CL 108 02/08/2015   CO2 24 02/08/2015   GLUCOSE 88 02/08/2015   BUN 18 02/08/2015   CREATININE 1.34* 02/08/2015   CALCIUM 9.6 02/08/2015    Studies/Results: Dg Lumbar Spine 2-3 Views  02/18/2015   CLINICAL DATA:  Lumbar disc disease.  EXAM: DG C-ARM 61-120 MIN; LUMBAR SPINE - 2-3 VIEW  COMPARISON:  MRI dated 01/16/2015  FINDINGS: AP and lateral C-arm images demonstrate the patient has undergone interbody and posterior fusion at L4-5 and L5-S1. Hardware appears in good position in the AP and lateral projections.  IMPRESSION: Fusions performed at L4-5 and L5-S1.   Electronically Signed   By: Francene Boyers M.D.   On: 02/18/2015 14:24   Dg C-arm 1-60 Min  02/18/2015   CLINICAL DATA:  Lumbar disc disease.  EXAM: DG C-ARM 61-120 MIN; LUMBAR SPINE - 2-3 VIEW  COMPARISON:  MRI dated 01/16/2015  FINDINGS: AP and lateral C-arm images demonstrate the patient has undergone interbody and posterior fusion at L4-5 and L5-S1. Hardware  appears in good position in the AP and lateral projections.  IMPRESSION: Fusions performed at L4-5 and L5-S1.   Electronically Signed   By: Francene Boyers M.D.   On: 02/18/2015 14:24    Assessment/Plan: Pt has nobody home with her for assistance and would to go to a SNF   LOS: 1 day    Kameshia Madruga S 02/19/2015, 1:11 PM

## 2015-02-20 NOTE — Progress Notes (Signed)
CSW (Clinical Child psychotherapist) prepared pt dc packet and placed with shadow chart. CSW arranged non-emergent ambulance transport. Pt, pt nurse, and facility informed. Pt wanted to notify family herself. CSW signing off.  Shelanda Duvall Lajean Saver Weekend CSW [phone]

## 2015-02-20 NOTE — Discharge Summary (Signed)
Pt doing well today. Back still sore as expected. Mobilizing well and seems pleased. Good strength. To SNF today. F/U in 3 weeks

## 2015-02-20 NOTE — Progress Notes (Signed)
D/C orders received, pt for D/C SNF today.  IV and telemetry D/C.  Rx and D/C instructions given with verbalized understanding. EMS brought pt downstairs via stretcher.

## 2015-02-20 NOTE — Clinical Social Work Placement (Addendum)
   CLINICAL SOCIAL WORK PLACEMENT  NOTE  Date:  02/20/2015  Patient Details  Name: Destiny House MRN: [id number] Date of Birth: [birth year]  Clinical Social Work is seeking post-discharge placement for this patient at the Skilled  Nursing Facility level of care (*CSW will initial, date and re-position this form in  chart as items are completed):  Yes   Patient/family provided with [facility] Clinical Social Work Department's list of facilities offering this level of care within the geographic area requested by the patient (or if unable, by the patient's family).  Yes   Patient/family informed of their freedom to choose among providers that offer the needed level of care, that participate in Medicare, Medicaid or managed care program needed by the patient, have an available bed and are willing to accept the patient.  Yes   Patient/family informed of [facility]'s ownership interest in [facility] and [facility], as well as of the fact that they are under no obligation to receive care at these facilities.  PASRR submitted to EDS on       PASRR number received on       Existing PASRR number confirmed on 02/19/15     FL2 transmitted to all facilities in geographic area requested by pt/family on 02/19/15     FL2 transmitted to all facilities within larger geographic area on       Patient informed that his/her managed care company has contracts with or will negotiate with certain facilities, including the following:        Yes   Patient/family informed of bed offers received.  Patient chooses bed at [facility]     Physician recommends and patient chooses bed at      Patient to be transferred to [facility] on 02/20/15.  Patient to be transferred to facility by [facility]     Patient family notified on 02/20/15 of transfer.  Name of family member notified:  Pt declined for CSW to contact family and stated she would notify herself.  PHYSICIAN        Additional Comment:    _______________________________________________ Sharol Harness, Theresia Majors [phone]

## 2015-03-30 ENCOUNTER — Other Ambulatory Visit: Payer: Self-pay | Admitting: Neurological Surgery

## 2015-03-30 ENCOUNTER — Ambulatory Visit (INDEPENDENT_AMBULATORY_CARE_PROVIDER_SITE_OTHER): Payer: Medicare Other

## 2015-03-30 DIAGNOSIS — M4327 Fusion of spine, lumbosacral region: Secondary | ICD-10-CM

## 2015-03-30 DIAGNOSIS — M4310 Spondylolisthesis, site unspecified: Secondary | ICD-10-CM

## 2015-06-22 ENCOUNTER — Other Ambulatory Visit: Payer: Self-pay | Admitting: Neurological Surgery

## 2015-06-22 ENCOUNTER — Ambulatory Visit (INDEPENDENT_AMBULATORY_CARE_PROVIDER_SITE_OTHER): Payer: Medicare Other

## 2015-06-22 DIAGNOSIS — M4310 Spondylolisthesis, site unspecified: Secondary | ICD-10-CM

## 2015-06-22 DIAGNOSIS — M545 Low back pain: Secondary | ICD-10-CM

## 2015-08-05 DIAGNOSIS — I714 Abdominal aortic aneurysm, without rupture: Secondary | ICD-10-CM | POA: Diagnosis not present

## 2015-08-05 DIAGNOSIS — I712 Thoracic aortic aneurysm, without rupture: Secondary | ICD-10-CM | POA: Diagnosis not present

## 2015-08-10 DIAGNOSIS — I712 Thoracic aortic aneurysm, without rupture: Secondary | ICD-10-CM | POA: Diagnosis not present

## 2015-08-10 DIAGNOSIS — I714 Abdominal aortic aneurysm, without rupture: Secondary | ICD-10-CM | POA: Diagnosis not present

## 2015-08-25 DIAGNOSIS — I719 Aortic aneurysm of unspecified site, without rupture: Secondary | ICD-10-CM | POA: Diagnosis not present

## 2015-08-25 DIAGNOSIS — I712 Thoracic aortic aneurysm, without rupture: Secondary | ICD-10-CM | POA: Diagnosis not present

## 2015-08-25 DIAGNOSIS — I714 Abdominal aortic aneurysm, without rupture: Secondary | ICD-10-CM | POA: Diagnosis not present

## 2015-08-25 DIAGNOSIS — R911 Solitary pulmonary nodule: Secondary | ICD-10-CM | POA: Diagnosis not present

## 2015-08-31 DIAGNOSIS — I7103 Dissection of thoracoabdominal aorta: Secondary | ICD-10-CM | POA: Diagnosis not present

## 2015-09-14 ENCOUNTER — Ambulatory Visit (INDEPENDENT_AMBULATORY_CARE_PROVIDER_SITE_OTHER): Payer: Medicare Other

## 2015-09-14 ENCOUNTER — Other Ambulatory Visit: Payer: Self-pay | Admitting: Neurological Surgery

## 2015-09-14 DIAGNOSIS — M4310 Spondylolisthesis, site unspecified: Secondary | ICD-10-CM

## 2015-09-14 DIAGNOSIS — M545 Low back pain: Secondary | ICD-10-CM

## 2015-09-14 DIAGNOSIS — Z9889 Other specified postprocedural states: Secondary | ICD-10-CM | POA: Diagnosis not present

## 2015-09-20 DIAGNOSIS — G4700 Insomnia, unspecified: Secondary | ICD-10-CM | POA: Diagnosis not present

## 2015-09-20 DIAGNOSIS — G894 Chronic pain syndrome: Secondary | ICD-10-CM | POA: Diagnosis not present

## 2015-09-20 DIAGNOSIS — I714 Abdominal aortic aneurysm, without rupture: Secondary | ICD-10-CM | POA: Diagnosis not present

## 2015-09-20 DIAGNOSIS — Z79891 Long term (current) use of opiate analgesic: Secondary | ICD-10-CM | POA: Diagnosis not present

## 2015-09-20 DIAGNOSIS — M47817 Spondylosis without myelopathy or radiculopathy, lumbosacral region: Secondary | ICD-10-CM | POA: Diagnosis not present

## 2015-10-21 DIAGNOSIS — G4700 Insomnia, unspecified: Secondary | ICD-10-CM | POA: Diagnosis not present

## 2015-10-21 DIAGNOSIS — G894 Chronic pain syndrome: Secondary | ICD-10-CM | POA: Diagnosis not present

## 2015-10-21 DIAGNOSIS — M47817 Spondylosis without myelopathy or radiculopathy, lumbosacral region: Secondary | ICD-10-CM | POA: Diagnosis not present

## 2015-11-16 DIAGNOSIS — F329 Major depressive disorder, single episode, unspecified: Secondary | ICD-10-CM | POA: Diagnosis not present

## 2015-11-16 DIAGNOSIS — G894 Chronic pain syndrome: Secondary | ICD-10-CM | POA: Diagnosis not present

## 2015-11-16 DIAGNOSIS — M47817 Spondylosis without myelopathy or radiculopathy, lumbosacral region: Secondary | ICD-10-CM | POA: Diagnosis not present

## 2015-11-16 DIAGNOSIS — G4700 Insomnia, unspecified: Secondary | ICD-10-CM | POA: Diagnosis not present

## 2015-12-15 DIAGNOSIS — M47817 Spondylosis without myelopathy or radiculopathy, lumbosacral region: Secondary | ICD-10-CM | POA: Diagnosis not present

## 2015-12-15 DIAGNOSIS — G894 Chronic pain syndrome: Secondary | ICD-10-CM | POA: Diagnosis not present

## 2015-12-15 DIAGNOSIS — G4700 Insomnia, unspecified: Secondary | ICD-10-CM | POA: Diagnosis not present

## 2016-01-24 DIAGNOSIS — Z79891 Long term (current) use of opiate analgesic: Secondary | ICD-10-CM | POA: Diagnosis not present

## 2016-01-24 DIAGNOSIS — G894 Chronic pain syndrome: Secondary | ICD-10-CM | POA: Diagnosis not present

## 2016-01-24 DIAGNOSIS — G4700 Insomnia, unspecified: Secondary | ICD-10-CM | POA: Diagnosis not present

## 2016-01-24 DIAGNOSIS — M47817 Spondylosis without myelopathy or radiculopathy, lumbosacral region: Secondary | ICD-10-CM | POA: Diagnosis not present

## 2016-03-16 ENCOUNTER — Other Ambulatory Visit: Payer: Self-pay | Admitting: Physical Medicine and Rehabilitation

## 2016-03-16 ENCOUNTER — Ambulatory Visit (INDEPENDENT_AMBULATORY_CARE_PROVIDER_SITE_OTHER): Payer: Medicare Other

## 2016-03-16 DIAGNOSIS — R1031 Right lower quadrant pain: Secondary | ICD-10-CM

## 2016-03-16 DIAGNOSIS — M25551 Pain in right hip: Secondary | ICD-10-CM

## 2016-03-20 DIAGNOSIS — G894 Chronic pain syndrome: Secondary | ICD-10-CM | POA: Diagnosis not present

## 2016-03-20 DIAGNOSIS — M47817 Spondylosis without myelopathy or radiculopathy, lumbosacral region: Secondary | ICD-10-CM | POA: Diagnosis not present

## 2016-03-20 DIAGNOSIS — G4700 Insomnia, unspecified: Secondary | ICD-10-CM | POA: Diagnosis not present

## 2016-04-17 DIAGNOSIS — G894 Chronic pain syndrome: Secondary | ICD-10-CM | POA: Diagnosis not present

## 2016-04-17 DIAGNOSIS — G4700 Insomnia, unspecified: Secondary | ICD-10-CM | POA: Diagnosis not present

## 2016-04-17 DIAGNOSIS — M47817 Spondylosis without myelopathy or radiculopathy, lumbosacral region: Secondary | ICD-10-CM | POA: Diagnosis not present

## 2016-05-15 DIAGNOSIS — Z79891 Long term (current) use of opiate analgesic: Secondary | ICD-10-CM | POA: Diagnosis not present

## 2016-05-15 DIAGNOSIS — M47817 Spondylosis without myelopathy or radiculopathy, lumbosacral region: Secondary | ICD-10-CM | POA: Diagnosis not present

## 2016-05-15 DIAGNOSIS — G4700 Insomnia, unspecified: Secondary | ICD-10-CM | POA: Diagnosis not present

## 2016-05-15 DIAGNOSIS — G894 Chronic pain syndrome: Secondary | ICD-10-CM | POA: Diagnosis not present

## 2016-06-12 DIAGNOSIS — M47817 Spondylosis without myelopathy or radiculopathy, lumbosacral region: Secondary | ICD-10-CM | POA: Diagnosis not present

## 2016-06-12 DIAGNOSIS — G4700 Insomnia, unspecified: Secondary | ICD-10-CM | POA: Diagnosis not present

## 2016-06-12 DIAGNOSIS — G894 Chronic pain syndrome: Secondary | ICD-10-CM | POA: Diagnosis not present

## 2016-06-27 DIAGNOSIS — M47817 Spondylosis without myelopathy or radiculopathy, lumbosacral region: Secondary | ICD-10-CM | POA: Diagnosis not present

## 2016-07-11 DIAGNOSIS — M47817 Spondylosis without myelopathy or radiculopathy, lumbosacral region: Secondary | ICD-10-CM | POA: Diagnosis not present

## 2016-08-11 DIAGNOSIS — G4700 Insomnia, unspecified: Secondary | ICD-10-CM | POA: Diagnosis not present

## 2016-08-11 DIAGNOSIS — K5903 Drug induced constipation: Secondary | ICD-10-CM | POA: Diagnosis not present

## 2016-08-11 DIAGNOSIS — M47817 Spondylosis without myelopathy or radiculopathy, lumbosacral region: Secondary | ICD-10-CM | POA: Diagnosis not present

## 2016-08-11 DIAGNOSIS — G894 Chronic pain syndrome: Secondary | ICD-10-CM | POA: Diagnosis not present

## 2016-08-11 DIAGNOSIS — Z79891 Long term (current) use of opiate analgesic: Secondary | ICD-10-CM | POA: Diagnosis not present

## 2016-09-08 DIAGNOSIS — M47817 Spondylosis without myelopathy or radiculopathy, lumbosacral region: Secondary | ICD-10-CM | POA: Diagnosis not present

## 2016-09-08 DIAGNOSIS — G894 Chronic pain syndrome: Secondary | ICD-10-CM | POA: Diagnosis not present

## 2016-09-08 DIAGNOSIS — G4700 Insomnia, unspecified: Secondary | ICD-10-CM | POA: Diagnosis not present

## 2016-10-06 DIAGNOSIS — G894 Chronic pain syndrome: Secondary | ICD-10-CM | POA: Diagnosis not present

## 2016-10-06 DIAGNOSIS — G4700 Insomnia, unspecified: Secondary | ICD-10-CM | POA: Diagnosis not present

## 2016-10-06 DIAGNOSIS — M47817 Spondylosis without myelopathy or radiculopathy, lumbosacral region: Secondary | ICD-10-CM | POA: Diagnosis not present

## 2016-10-10 DIAGNOSIS — M47817 Spondylosis without myelopathy or radiculopathy, lumbosacral region: Secondary | ICD-10-CM | POA: Diagnosis not present

## 2017-07-01 IMAGING — CR DG LUMBAR SPINE 2-3V
2 series · 2 of 2 positions shown · non-contrast
Comparison: Lumbar spine series of June 22, 2015

CLINICAL DATA: Follow-up of lumbar spine surgery in January 2015;
persistent right-sided lower back pain

EXAM:
LUMBAR SPINE - 2-3 VIEW

[l-spine ap]
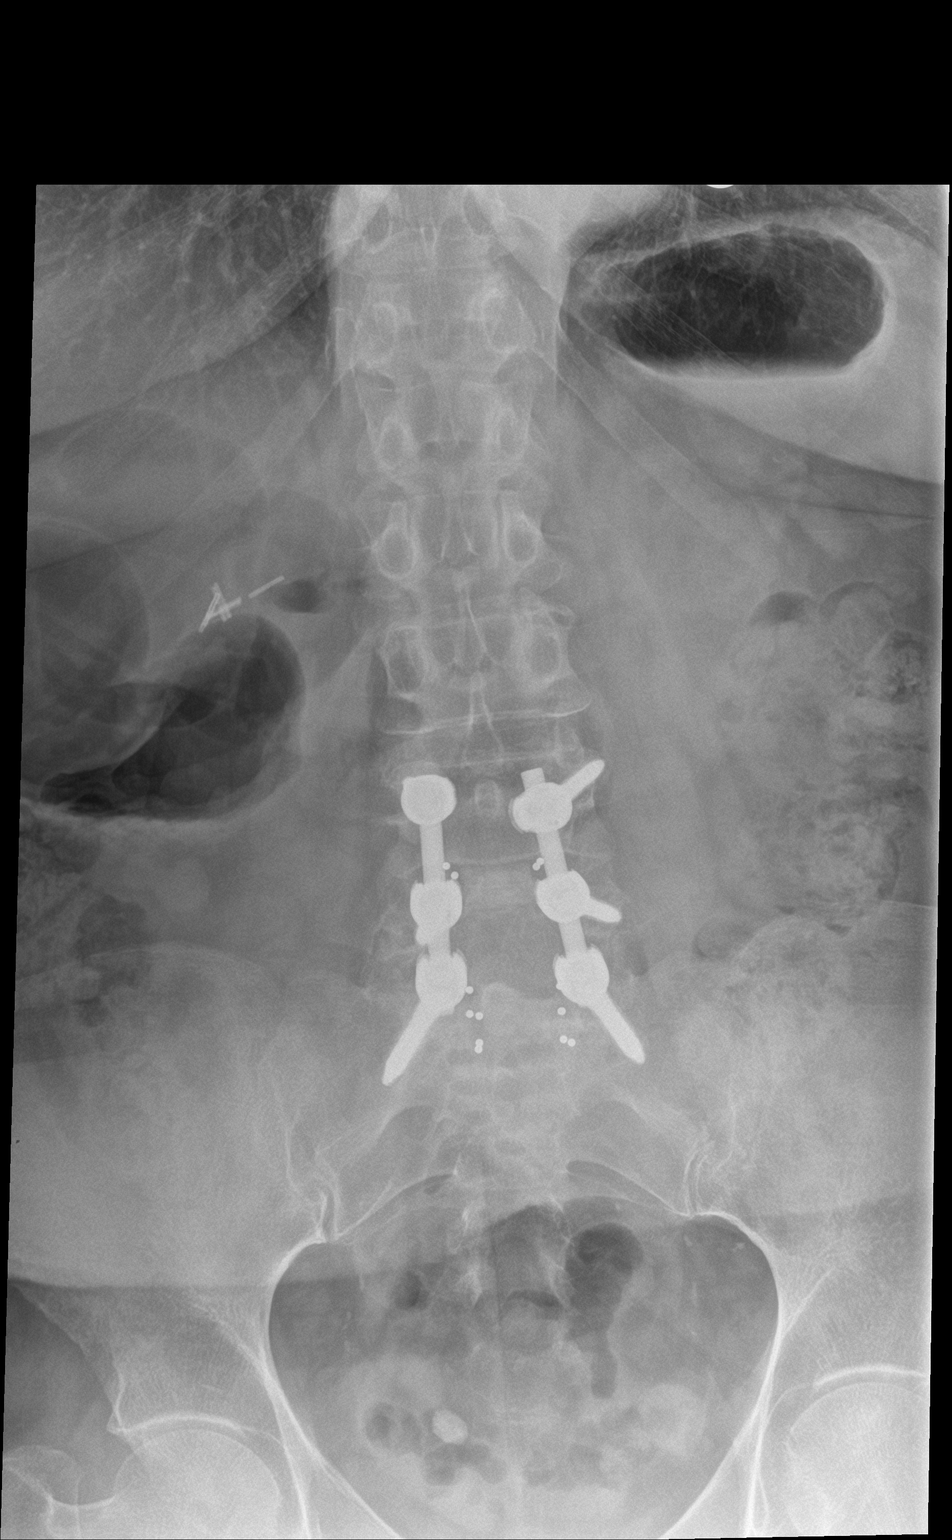

[l-spine lat]
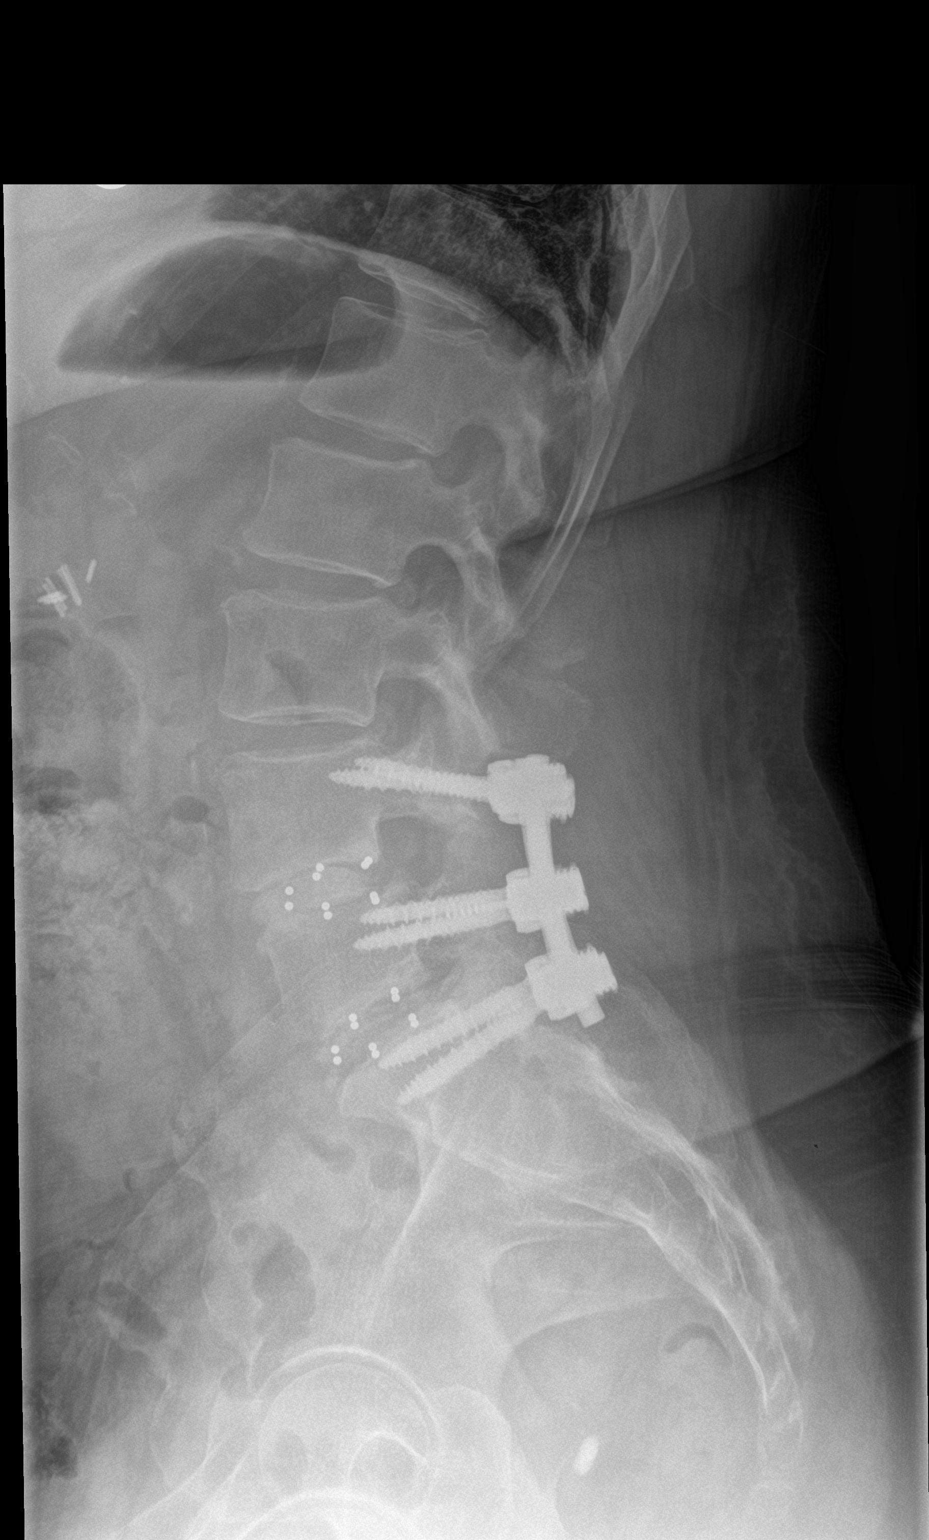

[2 of 2 positions shown; findings below may reference images not displayed]

FINDINGS: The lumbar vertebral bodies are preserved in height. Inter discal
fusion devices at L4-5 and L5-S1 are present and stable in
appearance. Pedicle screws at L4, L5, and S1 appear intact and
stable in position. There is no significant disc space narrowing at
L1-2, L2-3, and L3-4. There is no spondylolisthesis. The pedicles at
L1 through L3 are intact. The transverse processes appear intact
where visualized. The observed portions of the sacrum are normal.
IMPRESSION: Postfusion changes at L4 through S1 which appear stable. There is no
acute bony abnormality.

## 2018-01-01 IMAGING — DX DG HIP (WITH OR WITHOUT PELVIS) 2-3V*R*
3 series · 3 of 3 positions shown · non-contrast
Comparison: None.

CLINICAL DATA: Right hip pain for several months, no known injury,
initial encounter

EXAM:
DG HIP (WITH OR WITHOUT PELVIS) 2-3V RIGHT

[hip ap]
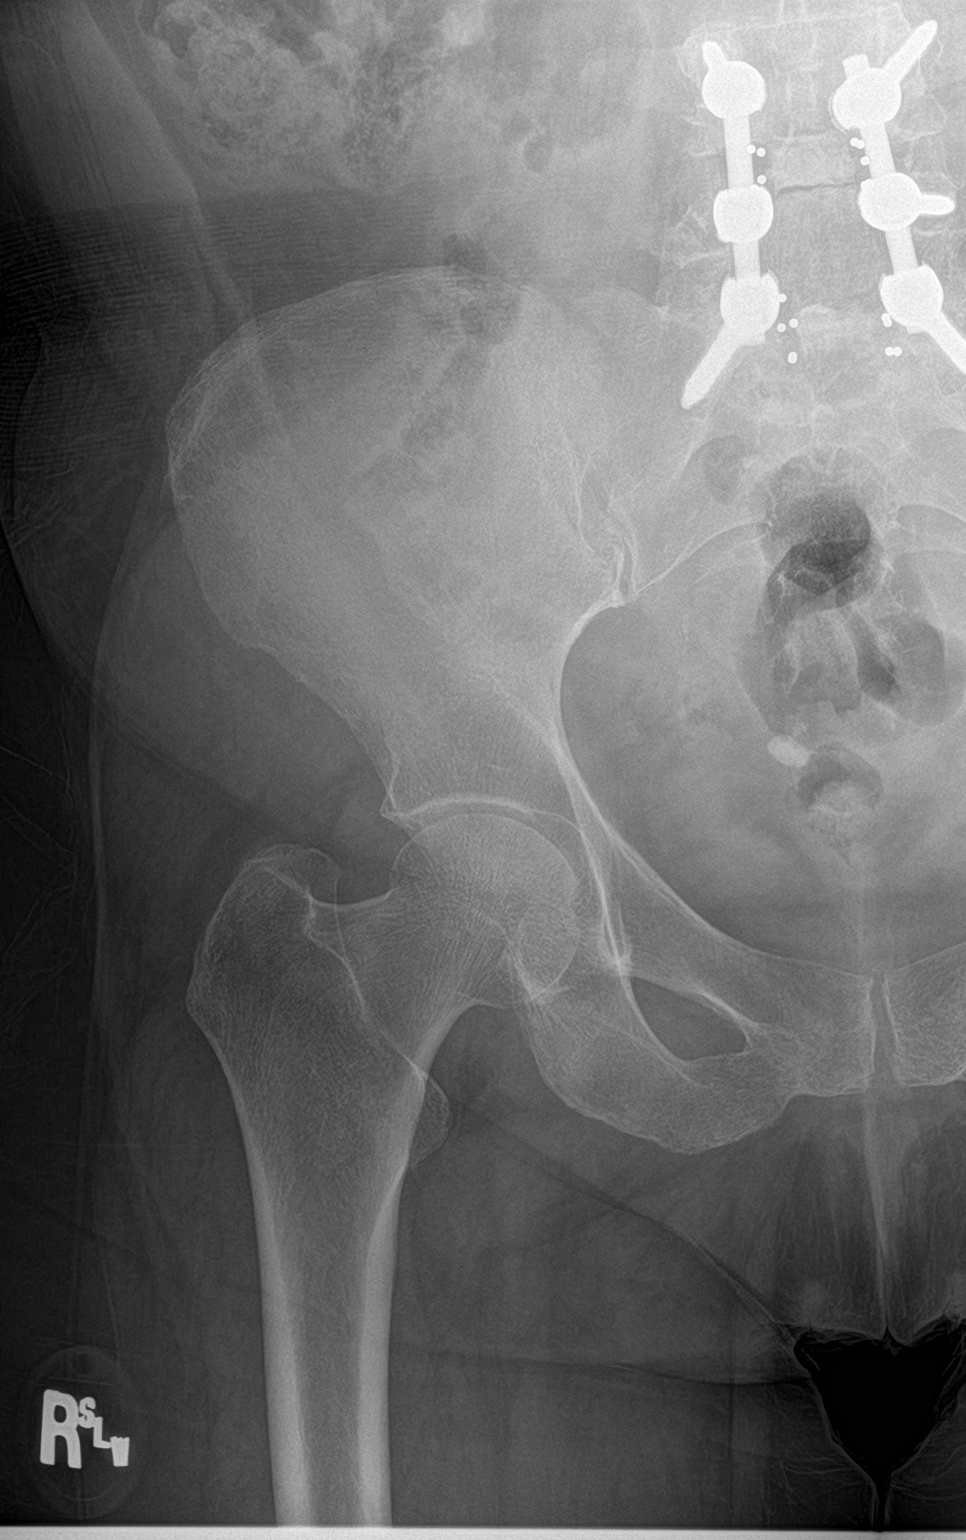

[pelvis ap]
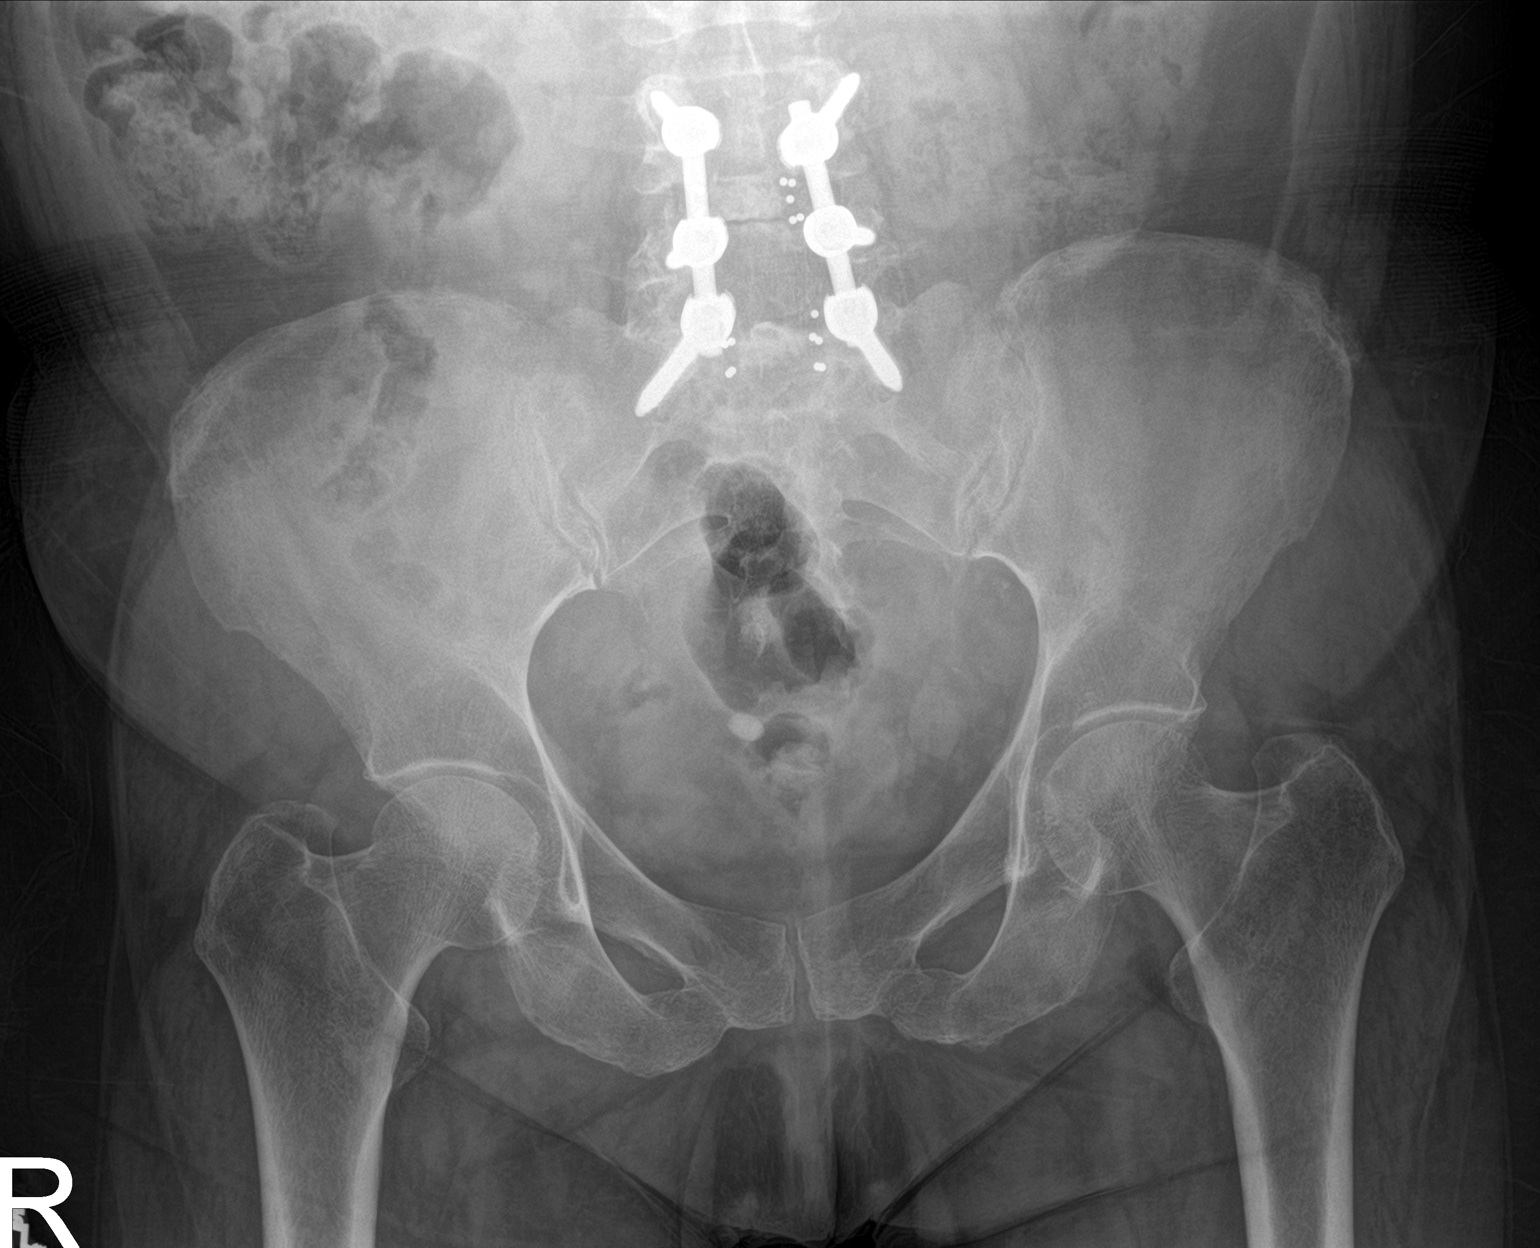

[hip lat]
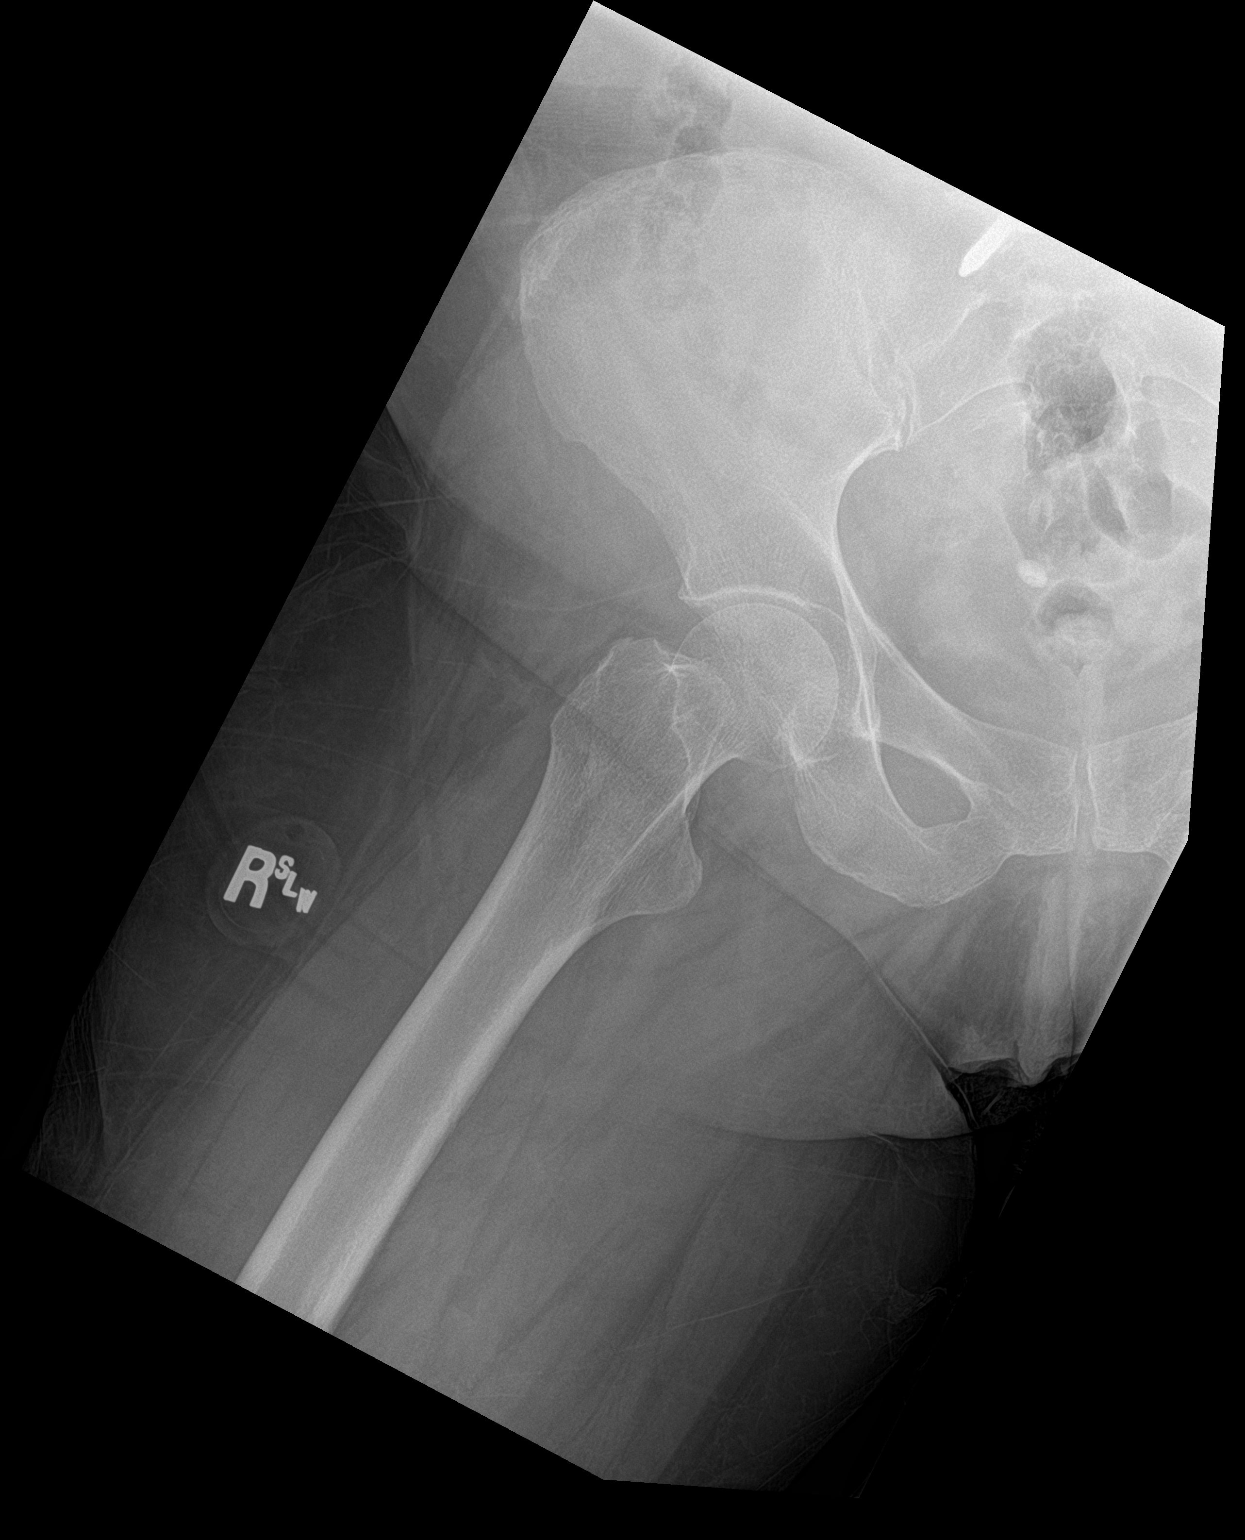

[3 of 3 positions shown; findings below may reference images not displayed]

FINDINGS: Postsurgical changes are noted in the lower lumbar spine. No acute
fracture or dislocation is seen. The pelvic ring is intact.
Calcification is noted in the midline of the abdomen stable from the
prior exam. This may represent a phlebolith although the possibility
of small bladder calculus could not be totally excluded.
IMPRESSION: Postsurgical changes.  No acute bony abnormality is noted.

Question phlebolith versus small bladder calculus

## 2019-04-24 DEATH — deceased
